# Patient Record
Sex: Female | Born: 1965 | Race: White | Hispanic: No | Marital: Married | Smoking: Never smoker
Health system: Southern US, Community
[De-identification: ages and names within clinical notes are randomized; demographics above are authoritative.]

## PROBLEM LIST (undated history)

## (undated) DIAGNOSIS — J302 Other seasonal allergic rhinitis: Secondary | ICD-10-CM

## (undated) DIAGNOSIS — N289 Disorder of kidney and ureter, unspecified: Secondary | ICD-10-CM

## (undated) DIAGNOSIS — N83209 Unspecified ovarian cyst, unspecified side: Secondary | ICD-10-CM

## (undated) DIAGNOSIS — K562 Volvulus: Secondary | ICD-10-CM

## (undated) HISTORY — DX: Other seasonal allergic rhinitis: J30.2

## (undated) HISTORY — DX: Volvulus: K56.2

## (undated) HISTORY — DX: Unspecified ovarian cyst, unspecified side: N83.209

---

## 1998-01-13 ENCOUNTER — Inpatient Hospital Stay (HOSPITAL_COMMUNITY): Admission: AD | Admit: 1998-01-13 | Discharge: 1998-01-13 | Payer: Self-pay | Admitting: Obstetrics and Gynecology

## 1998-01-17 ENCOUNTER — Observation Stay (HOSPITAL_COMMUNITY): Admission: AD | Admit: 1998-01-17 | Discharge: 1998-01-17 | Payer: Self-pay | Admitting: Obstetrics and Gynecology

## 1998-01-18 ENCOUNTER — Observation Stay (HOSPITAL_COMMUNITY): Admission: AD | Admit: 1998-01-18 | Discharge: 1998-01-18 | Payer: Self-pay | Admitting: Obstetrics and Gynecology

## 1998-01-25 ENCOUNTER — Inpatient Hospital Stay (HOSPITAL_COMMUNITY): Admission: AD | Admit: 1998-01-25 | Discharge: 1998-01-27 | Payer: Self-pay | Admitting: *Deleted

## 1998-03-06 ENCOUNTER — Other Ambulatory Visit: Admission: RE | Admit: 1998-03-06 | Discharge: 1998-03-06 | Payer: Self-pay | Admitting: Obstetrics and Gynecology

## 1999-08-30 ENCOUNTER — Other Ambulatory Visit: Admission: RE | Admit: 1999-08-30 | Discharge: 1999-08-30 | Payer: Self-pay | Admitting: Obstetrics and Gynecology

## 2002-05-05 ENCOUNTER — Other Ambulatory Visit: Admission: RE | Admit: 2002-05-05 | Discharge: 2002-05-05 | Payer: Self-pay | Admitting: Obstetrics and Gynecology

## 2003-07-19 ENCOUNTER — Other Ambulatory Visit: Admission: RE | Admit: 2003-07-19 | Discharge: 2003-07-19 | Payer: Self-pay | Admitting: Obstetrics and Gynecology

## 2003-12-08 ENCOUNTER — Ambulatory Visit (HOSPITAL_COMMUNITY): Admission: RE | Admit: 2003-12-08 | Discharge: 2003-12-08 | Payer: Self-pay | Admitting: Internal Medicine

## 2004-04-13 ENCOUNTER — Ambulatory Visit: Payer: Self-pay | Admitting: Internal Medicine

## 2005-06-27 ENCOUNTER — Ambulatory Visit: Payer: Self-pay | Admitting: Internal Medicine

## 2006-08-06 ENCOUNTER — Ambulatory Visit: Payer: Self-pay | Admitting: Internal Medicine

## 2017-10-09 ENCOUNTER — Encounter: Payer: Self-pay | Admitting: Obstetrics and Gynecology

## 2020-09-26 ENCOUNTER — Inpatient Hospital Stay (HOSPITAL_BASED_OUTPATIENT_CLINIC_OR_DEPARTMENT_OTHER)
Admission: EM | Admit: 2020-09-26 | Discharge: 2020-09-29 | DRG: 393 | Disposition: A | Discharge status: Home / Self Care | Payer: BLUE CROSS/BLUE SHIELD | Attending: Internal Medicine | Admitting: Internal Medicine

## 2020-09-26 ENCOUNTER — Emergency Department (HOSPITAL_BASED_OUTPATIENT_CLINIC_OR_DEPARTMENT_OTHER): Payer: BLUE CROSS/BLUE SHIELD

## 2020-09-26 ENCOUNTER — Other Ambulatory Visit: Payer: Self-pay

## 2020-09-26 ENCOUNTER — Encounter (HOSPITAL_BASED_OUTPATIENT_CLINIC_OR_DEPARTMENT_OTHER): Payer: Self-pay | Admitting: Obstetrics and Gynecology

## 2020-09-26 DIAGNOSIS — Z79899 Other long term (current) drug therapy: Secondary | ICD-10-CM | POA: Diagnosis not present

## 2020-09-26 DIAGNOSIS — Z888 Allergy status to other drugs, medicaments and biological substances status: Secondary | ICD-10-CM

## 2020-09-26 DIAGNOSIS — R933 Abnormal findings on diagnostic imaging of other parts of digestive tract: Secondary | ICD-10-CM | POA: Diagnosis not present

## 2020-09-26 DIAGNOSIS — R109 Unspecified abdominal pain: Secondary | ICD-10-CM | POA: Diagnosis not present

## 2020-09-26 DIAGNOSIS — K567 Ileus, unspecified: Secondary | ICD-10-CM | POA: Diagnosis present

## 2020-09-26 DIAGNOSIS — Z20822 Contact with and (suspected) exposure to covid-19: Secondary | ICD-10-CM | POA: Diagnosis present

## 2020-09-26 DIAGNOSIS — N83202 Unspecified ovarian cyst, left side: Secondary | ICD-10-CM | POA: Diagnosis present

## 2020-09-26 DIAGNOSIS — J690 Pneumonitis due to inhalation of food and vomit: Secondary | ICD-10-CM | POA: Diagnosis present

## 2020-09-26 DIAGNOSIS — J029 Acute pharyngitis, unspecified: Secondary | ICD-10-CM | POA: Diagnosis not present

## 2020-09-26 DIAGNOSIS — K562 Volvulus: Secondary | ICD-10-CM | POA: Diagnosis not present

## 2020-09-26 DIAGNOSIS — K644 Residual hemorrhoidal skin tags: Secondary | ICD-10-CM | POA: Diagnosis present

## 2020-09-26 DIAGNOSIS — K648 Other hemorrhoids: Secondary | ICD-10-CM | POA: Diagnosis present

## 2020-09-26 DIAGNOSIS — K5909 Other constipation: Secondary | ICD-10-CM | POA: Diagnosis present

## 2020-09-26 DIAGNOSIS — K56609 Unspecified intestinal obstruction, unspecified as to partial versus complete obstruction: Secondary | ICD-10-CM

## 2020-09-26 DIAGNOSIS — R079 Chest pain, unspecified: Secondary | ICD-10-CM

## 2020-09-26 DIAGNOSIS — Z791 Long term (current) use of non-steroidal anti-inflammatories (NSAID): Secondary | ICD-10-CM

## 2020-09-26 DIAGNOSIS — K5939 Other megacolon: Secondary | ICD-10-CM | POA: Diagnosis not present

## 2020-09-26 HISTORY — DX: Disorder of kidney and ureter, unspecified: N28.9

## 2020-09-26 LAB — URINALYSIS, ROUTINE W REFLEX MICROSCOPIC
Bilirubin Urine: NEGATIVE
Glucose, UA: NEGATIVE mg/dL
Hgb urine dipstick: NEGATIVE
Leukocytes,Ua: NEGATIVE
Nitrite: NEGATIVE
Protein, ur: NEGATIVE mg/dL
Specific Gravity, Urine: 1.005 (ref 1.005–1.030)
pH: 8 (ref 5.0–8.0)

## 2020-09-26 LAB — CBC
HCT: 40.3 % (ref 36.0–46.0)
Hemoglobin: 13.7 g/dL (ref 12.0–15.0)
MCH: 28.7 pg (ref 26.0–34.0)
MCHC: 34 g/dL (ref 30.0–36.0)
MCV: 84.5 fL (ref 80.0–100.0)
Platelets: 169 10*3/uL (ref 150–400)
RBC: 4.77 MIL/uL (ref 3.87–5.11)
RDW: 13 % (ref 11.5–15.5)
WBC: 8.3 10*3/uL (ref 4.0–10.5)
nRBC: 0 % (ref 0.0–0.2)

## 2020-09-26 LAB — COMPREHENSIVE METABOLIC PANEL
ALT: 21 U/L (ref 0–44)
AST: 36 U/L (ref 15–41)
Albumin: 4.5 g/dL (ref 3.5–5.0)
Alkaline Phosphatase: 45 U/L (ref 38–126)
Anion gap: 9 (ref 5–15)
BUN: 7 mg/dL (ref 6–20)
CO2: 25 mmol/L (ref 22–32)
Calcium: 9.6 mg/dL (ref 8.9–10.3)
Chloride: 102 mmol/L (ref 98–111)
Creatinine, Ser: 0.75 mg/dL (ref 0.44–1.00)
GFR, Estimated: >60 mL/min (ref >60)
Glucose, Bld: 95 mg/dL (ref 70–99)
Potassium: 5.1 mmol/L (ref 3.5–5.1)
Sodium: 136 mmol/L (ref 135–145)
Total Bilirubin: 0.8 mg/dL (ref 0.3–1.2)
Total Protein: 6.9 g/dL (ref 6.5–8.1)

## 2020-09-26 LAB — RESP PANEL BY RT-PCR (FLU A&B, COVID) ARPGX2
Influenza A by PCR: NEGATIVE
Influenza B by PCR: NEGATIVE
SARS Coronavirus 2 by RT PCR: NEGATIVE

## 2020-09-26 LAB — LIPASE, BLOOD: Lipase: 36 U/L (ref 11–51)

## 2020-09-26 LAB — PREGNANCY, URINE: Preg Test, Ur: NEGATIVE

## 2020-09-26 MED ORDER — IOHEXOL 300 MG/ML  SOLN
80.0000 mL | Freq: Once | INTRAMUSCULAR | Status: AC | PRN
  Administered 2020-09-26: 80 mL via INTRAVENOUS

## 2020-09-26 MED ORDER — ONDANSETRON HCL 4 MG PO TABS: 4.0000 mg | ORAL_TABLET | Freq: Four times a day (QID) | ORAL | Status: DC | PRN

## 2020-09-26 MED ORDER — ONDANSETRON HCL 4 MG/2ML IJ SOLN: 4.0000 mg | Freq: Four times a day (QID) | INTRAMUSCULAR | Status: DC | PRN

## 2020-09-26 MED ORDER — LACTATED RINGERS IV SOLN: INTRAVENOUS | Status: AC

## 2020-09-26 MED ORDER — MORPHINE SULFATE (PF) 2 MG/ML IV SOLN: 2.0000 mg | INTRAVENOUS | Status: DC | PRN

## 2020-09-26 NOTE — ED Provider Notes (Addendum)
[facility] EMERGENCY DEPT Provider Note   CSN: [id number] Arrival date & time: 09/26/20  1143     History Chief Complaint  Patient presents with  . Abdominal Pain    Dawn Potter is a 55 y.o. female.  HPI      55 year old female with history of exploratory laparotomy 30 years ago, episodes of constipation, presents with concern for severe abdominal pain and constipation.  No BM for 6 days, maybe longer Nausea, one episode of vomiting after taking magnesium citrate Abdominal pain, lower abdomen, feels "uterine", cramping, feels like a contraction, every 15 minutes having it, shortest time between is 3 minutes IUD, thinks LMP was 3/23 Has had prior constipation with menses--Last Monday thought it might have been premenstrual and got medication but this became progressively worse through the weekend, if got up and moved around had to sit down. Sunday AM started taking tylenol Miralax 7 doses since Sunday Senna, docusate Passing flatus, now is passing yellow fluid per rectum No vaginal bleeding/discharge No urinary symptoms, no hematuria No fever Phenteramine, used to take half a dose and started full dose Had exploratory surgery at age of 79, found ovarian cyst and kidney infection    Past Medical History:  Diagnosis Date  . Renal disorder     There are no problems to display for this patient.   Past Surgical History:  Procedure Laterality Date  . exploratory abdominla surgery N/A      OB History    Gravida      Para      Term      Preterm      AB      Living  2     SAB      IAB      Ectopic      Multiple      Live Births  2           No family history on file.  Social History   Tobacco Use  . Smoking status: Never Smoker  . Smokeless tobacco: Never Used  Vaping Use  . Vaping Use: Never used  Substance Use Topics  . Alcohol use: Yes    Comment: Social  . Drug use: Never    Home Medications Prior to  Admission medications   Medication Sig Start Date End Date Taking? Authorizing Provider  diphenhydrAMINE (BENADRYL) 25 MG tablet Take 25 mg by mouth every 6 (six) hours as needed.   Yes [provider]  meloxicam (MOBIC) 15 MG tablet Take 15 mg by mouth daily.   Yes [provider]  phentermine 37.5 MG capsule Take 37.5 mg by mouth every morning.   Yes [provider]  zolpidem (AMBIEN) 10 MG tablet Take 10 mg by mouth at bedtime as needed for sleep.   Yes [provider]    Allergies    Ketoconazole  Review of Systems   Review of Systems  Constitutional: Negative for fever.  HENT: Negative for sore throat.   Respiratory: Negative for cough and shortness of breath.   Cardiovascular: Negative for chest pain.  Gastrointestinal: Positive for abdominal pain, constipation, nausea and vomiting.  Genitourinary: Negative for difficulty urinating, flank pain and vaginal bleeding.  Musculoskeletal: Negative for back pain and neck pain.  Skin: Negative for rash.  Neurological: Negative for syncope.    Physical Exam Updated Vital Signs BP (!) 146/81 (BP Location: Left Arm)   Pulse 78   Temp 98.1 F (36.7 C) (Oral)  Resp 16   Ht 5\' 8"  (1.727 m)   Wt 74.8 kg   LMP 08/30/2020   SpO2 99%   BMI 25.09 kg/m   Physical Exam Vitals and nursing note reviewed.  Constitutional:      General: She is not in acute distress.    Appearance: She is well-developed. She is not diaphoretic.  HENT:     Head: Normocephalic and atraumatic.  Eyes:     Conjunctiva/sclera: Conjunctivae normal.  Cardiovascular:     Rate and Rhythm: Normal rate and regular rhythm.     Heart sounds: No gallop.   Pulmonary:     Effort: Pulmonary effort is normal. No respiratory distress.     Breath sounds: Normal breath sounds. No wheezing or rales.  Abdominal:     General: There is distension.     Palpations: Abdomen is soft.     Tenderness: There is generalized abdominal  tenderness. There is no guarding.  Musculoskeletal:        General: No tenderness.     Cervical back: Normal range of motion.  Skin:    General: Skin is warm and dry.     Findings: No erythema or rash.  Neurological:     Mental Status: She is alert and oriented to person, place, and time.     ED Results / Procedures / Treatments   Labs (all labs ordered are listed, but only abnormal results are displayed) Labs Reviewed  URINALYSIS, ROUTINE W REFLEX MICROSCOPIC - Abnormal; Notable for the following components:      Result Value   Ketones, ur TRACE (*)    All other components within normal limits  LIPASE, BLOOD  COMPREHENSIVE METABOLIC PANEL  CBC  PREGNANCY, URINE    EKG None  Radiology CT ABDOMEN PELVIS W CONTRAST  Result Date: 09/26/2020 CLINICAL DATA:  Abdominal pain.  Concern for bowel obstruction. EXAM: CT ABDOMEN AND PELVIS WITH CONTRAST TECHNIQUE: Multidetector CT imaging of the abdomen and pelvis was performed using the standard protocol following bolus administration of intravenous contrast. CONTRAST:  47mL OMNIPAQUE IOHEXOL 300 MG/ML  SOLN COMPARISON:  12/08/2003 FINDINGS: Lower chest: 3 mm right lower lobe nodule on image 1 of series 4 is stable in the long interval since prior study consistent with benign etiology. Hepatobiliary: No suspicious focal abnormality within the liver parenchyma. There is no evidence for gallstones, gallbladder wall thickening, or pericholecystic fluid. No intrahepatic or extrahepatic biliary dilation. Pancreas: No focal mass lesion. No dilatation of the main duct. No intraparenchymal cyst. No peripancreatic edema. Spleen: No splenomegaly. No focal mass lesion. Adrenals/Urinary Tract: No adrenal nodule or mass. Kidneys unremarkable. No evidence for hydroureter. The urinary bladder appears normal for the degree of distention. Stomach/Bowel: Tiny hiatal hernia. Stomach is decompressed. Duodenum is normally positioned as is the ligament of Treitz.  Small bowel is nondilated. Terminal ileum is well seen on coronal image 52/5 and is positioned just anterior to the right psoas muscle on axial 55/2 with the ileocecal valve visible on image 56/2. The ascending and transverse colon is dilated and stool-filled in the colon remains distended and stool-filled down to the level of the sigmoid segment. This sigmoid segment is markedly elongated and extends up into the anterior right abdomen, anterior to the liver. The sigmoid segment is air-filled and dilated with no substantial stool volume. There is mesenteric twisting with 1 area of narrowing identified in the sigmoid colon in the right central abdomen (axial image 44/2) representing the distal sigmoid colon although a more  proximal area of sigmoid colon narrowing is not evident. Distal sigmoid colon and rectum are fluid-filled. Vascular/Lymphatic: No abdominal aortic aneurysm. There is no gastrohepatic or hepatoduodenal ligament lymphadenopathy. No retroperitoneal or mesenteric lymphadenopathy. No pelvic sidewall lymphadenopathy. Reproductive: Adjacent cystic structures identified in the left adnexal space measuring 3.6 cm in 3.0 cm. Both lesions have attenuation higher than would be expected for simple fluid. No right adnexal mass. IUD is visualized in the uterus. Other: No intraperitoneal free fluid. Musculoskeletal: No worrisome lytic or sclerotic osseous abnormality. IMPRESSION: 1. No evidence for small bowel obstruction. The colon is diffusely dilated with cecum measuring up to 7-8 cm diameter. Sigmoid colon measures up to 5 cm diameter. The sigmoid colon is markedly elongated and redundant, extending up into the right abdomen, anterior to the liver. There is an area of narrowing in the distal sigmoid colon as it enters the low pelvis with some associated mesenteric twisting, but a second area of sigmoid colon narrowing cannot be identified. There is no colonic wall thickening or associated pericolonic  edema/intraperitoneal fluid. Imaging features may be related to marked colonic ileus/colonic dysmotility with the single area of narrowing bean related to sigmoid colon becoming non dependent towards the rectum in the low pelvis. Sigmoid volvulus is also a consideration, but not a definite CT finding. 2. Complex cystic lesions in the left adnexal space/ovary measuring up to 3.6 cm. These may be hemorrhagic cysts. Pelvic ultrasound follow-up recommended to further evaluate. Electronically Signed   By: Kennith Center M.D.   On: 09/26/2020 14:32    Procedures Procedures   Medications Ordered in ED Medications  iohexol (OMNIPAQUE) 300 MG/ML solution 80 mL (80 mLs Intravenous Contrast Given 09/26/20 1328)    ED Course  I have reviewed the triage vital signs and the nursing notes.  Pertinent labs & imaging results that were available during my care of the patient were reviewed by me and considered in my medical decision making (see chart for details).    MDM Rules/Calculators/A&P                          55 year old female with history of exploratory laparotomy 30 years ago, episodes of constipation, presents with concern for severe abdominal pain and constipation.  DDx includes appendicitis, pancreatitis, cholecystitis, pyelonephritis, nephrolithiasis, diverticulitis, PID, ovarian torsion, ectopic pregnancy, and tuboovarian abscess, SBO, constipation.   Labs completed show no sign of pancreatitis, hepatitis or other acute abnormalities.  No sign of UTI. Pregnancy test negative.  CT abdomen pelvis shows significant dilation of cecum and sigmoid colon with elongated and reduntant extending into the right abdomen with area of narrowing with associated mesenteric twisting, may be secondary to colonic ileus/dysmotility or sigmoid volvulus, complex adnexal cysts.   Discussed with Dr. Fredricka Bonine General Surgery who recommends hospitalist admission and GI consult.  Discussed with Dr. Alvino Chapel hospitalist.    Spoke with Willette Cluster NP [facility] as patient has preference to see Dr. Andrey Farmer as an outpatient/PCP was going to refer her although they have not seen her.    Plan is to admit to [facility] with GI consult as above. Patient would like to go by POV which is reasonable given hemodynamic stability.    Final Clinical Impression(s) / ED Diagnoses Final diagnoses:  Intestinal obstruction, unspecified cause, unspecified whether partial or complete [facility])    Rx / DC Orders ED Discharge Orders    None        Alvira Monday, MD 09/26/20 1605

## 2020-09-26 NOTE — Progress Notes (Signed)
Received a call from [facility], Dr. Dalene Seltzer. Patient in ED with abdominal pain, nausea. CT scan shows diffusely dilated colon, cecum 7-8 cm. Colon markedly redundant.  Area of narrowing in the distal sigmoid with some associated mesenteric twisting but a second area of sigmoid colon narrowing cannot be identified. Sigmoid volvulus is a consideration.   Patient was going to be admitted to [facility]. I called ED back to see about getting patient brought to [facility] instead. Dr. Lavon Paganini is here and can proceed with urgent lower endoscopy if indicated.    * Patient known remotely to Dr. Leone Payor.

## 2020-09-26 NOTE — Consult Note (Addendum)
[facility] Surgery Consult Note  Dawn Potter [birth year]  [id number].    Requesting MD: Alvira Monday Chief Complaint/Reason for Consult: constipation  HPI:  Dawn Potter is a 54yo female who was transferred from [facility] ED to [facility] for evaluation of persistent constipation and abdominal pain. Patient states that she developed lower abdominal pain over the weekend. It is crampy and has gotten progressively worse.  She endorses lower abdominal cramps which feel like labor every 6 to 15 minutes since 10:00 this morning.  She has experienced this in the past and previously attributed it to her menstrual cycle.  This episode associated with nausea and 1 episode of emesis after taking magnesium citrate. She is passing some flatus and what sounds like mucus material but last BM was at least 6 days ago. She has tried taking miralax, senna, docusate with no benefit.   She had an exploratory laparotomy at age 29 which was done for a persistent high-grade fever.  She states the findings were a large ovarian cyst, she does not remember which side, and ultimately she was diagnosed with a severe kidney infection.  Since that time she has struggled with constipation chronically but this is the worst episode.   She is extremely active and essentially healthy, stating that she did all of the farm chores this past weekend despite having abdominal issues.  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  In the ED her BP was found to be elevated, otherwise vital signs stable. Lab work is unremarkable. CT scan shows no evidence for SBO; colon is diffusely dilated with cecum measuring up to 7-8 cm diameter; sigmoid colon is markedly elongated and redundant; there is an area of narrowing in the distal sigmoid colon as it enters the low pelvis with some associated mesenteric twisting; no colonic wall thickening or associated pericolonic edema/intraperitoneal fluid; ?colonic ileus/colonic dysmotility or  sigmoid volvulus.  Last colonoscopy  Anticoagulants: none Nonsmoker Drinks alcohol occasionally Denies illicit drug use  Review of Systems  All other systems reviewed and are negative.   All systems reviewed and otherwise negative except for as above  No family history on file.  Past Medical History:  Diagnosis Date  . Renal disorder     Past Surgical History:  Procedure Laterality Date  . exploratory abdominla surgery N/A     Social History:  reports that she has never smoked. She has never used smokeless tobacco. She reports current alcohol use. She reports that she does not use drugs.  Allergies:  Allergies  Allergen Reactions  . Ketoconazole     Hives    (Not in a hospital admission)   Prior to Admission medications   Medication Sig Start Date End Date Taking? Authorizing Provider  diphenhydrAMINE (BENADRYL) 25 MG tablet Take 25 mg by mouth every 6 (six) hours as needed.   Yes [provider]  meloxicam (MOBIC) 15 MG tablet Take 15 mg by mouth daily.   Yes [provider]  phentermine 37.5 MG capsule Take 37.5 mg by mouth every morning.   Yes [provider]  zolpidem (AMBIEN) 10 MG tablet Take 10 mg by mouth at bedtime as needed for sleep.   Yes [provider]    Blood pressure (!) 146/81, pulse 78, temperature 98.1 F (36.7 C), temperature source Oral, resp. rate 16, height 5\' 8"  (1.727 m), weight 74.8 kg, last menstrual period 08/30/2020, SpO2 99 %. Physical Exam: General: pleasant, WD/WN female who is sitting up in the  chair, appears quite comfortable in NAD HEENT: head is normocephalic, atraumatic.  Sclera are noninjected.  Pupils equal and round.  Ears and nose without any masses or lesions.  Mouth is pink and moist. Dentition in good condition Heart: regular, rate, and rhythm.  Normal s1,s2. No obvious murmurs, gallops, or rubs noted.  Palpable pedal pulses bilaterally  Lungs: CTAB, no wheezes, rhonchi, or rales  noted.  Respiratory effort nonlabored Abd: soft, very minimally subjectively tender, mildly distended, no masses.  Low midline laparotomy scar MS: no BUE/BLE edema, calves soft and nontender Skin: warm and dry with no masses, lesions, or rashes Psych: A&Ox4 with an appropriate affect Neuro: cranial nerves grossly intact, equal strength in BUE/BLE bilaterally, normal speech, thought process intact  Results for orders placed or performed during the hospital encounter of 09/26/20 (from the past 48 hour(s))  Lipase, blood     Status: None   Collection Time: 09/26/20 12:15 PM  Result Value Ref Range   Lipase 36 11 - 51 U/L    Comment: Performed at [facility]  Comprehensive metabolic panel     Status: None   Collection Time: 09/26/20 12:15 PM  Result Value Ref Range   Sodium 136 135 - 145 mmol/L   Potassium 5.1 3.5 - 5.1 mmol/L   Chloride 102 98 - 111 mmol/L   CO2 25 22 - 32 mmol/L   Glucose, Bld 95 70 - 99 mg/dL    Comment: Glucose reference range applies only to samples taken after fasting for at least 8 hours.   BUN 7 6 - 20 mg/dL   Creatinine, Ser 1.610.75 0.44 - 1.00 mg/dL   Calcium 9.6 8.9 - 09.610.3 mg/dL   Total Protein 6.9 6.5 - 8.1 g/dL   Albumin 4.5 3.5 - 5.0 g/dL   AST 36 15 - 41 U/L    Comment: SPECIMEN HEMOLYZED. HEMOLYSIS MAY AFFECT INTEGRITY OF RESULTS.   ALT 21 0 - 44 U/L   Alkaline Phosphatase 45 38 - 126 U/L   Total Bilirubin 0.8 0.3 - 1.2 mg/dL   GFR, Estimated >04>60 >54>60 mL/min    Comment: (NOTE) Calculated using the CKD-EPI Creatinine Equation (2021)    Anion gap 9 5 - 15    Comment: Performed at [facility]  CBC     Status: None   Collection Time: 09/26/20 12:15 PM  Result Value Ref Range   WBC 8.3 4.0 - 10.5 K/uL   RBC 4.77 3.87 - 5.11 MIL/uL   Hemoglobin 13.7 12.0 - 15.0 g/dL   HCT 09.840.3 11.936.0 - 14.746.0 %   MCV 84.5 80.0 - 100.0 fL   MCH 28.7 26.0 - 34.0 pg   MCHC 34.0 30.0 - 36.0 g/dL   RDW 82.913.0 56.211.5 - 13.015.5 %   Platelets 169  150 - 400 K/uL   nRBC 0.0 0.0 - 0.2 %    Comment: Performed at [facility]  Urinalysis, Routine w reflex microscopic Urine, Clean Catch     Status: Abnormal   Collection Time: 09/26/20 12:15 PM  Result Value Ref Range   Color, Urine YELLOW YELLOW   APPearance CLEAR CLEAR   Specific Gravity, Urine 1.005 1.005 - 1.030   pH 8.0 5.0 - 8.0   Glucose, UA NEGATIVE NEGATIVE mg/dL   Hgb urine dipstick NEGATIVE NEGATIVE   Bilirubin Urine NEGATIVE NEGATIVE   Ketones, ur TRACE (A) NEGATIVE mg/dL   Protein, ur NEGATIVE NEGATIVE mg/dL   Nitrite NEGATIVE NEGATIVE   Leukocytes,Ua  NEGATIVE NEGATIVE    Comment: Performed at [facility]  Pregnancy, urine     Status: None   Collection Time: 09/26/20 12:15 PM  Result Value Ref Range   Preg Test, Ur NEGATIVE NEGATIVE    Comment:        THE SENSITIVITY OF THIS METHODOLOGY IS >20 mIU/mL. Performed at [facility]    CT ABDOMEN PELVIS W CONTRAST  Result Date: 09/26/2020 CLINICAL DATA:  Abdominal pain.  Concern for bowel obstruction. EXAM: CT ABDOMEN AND PELVIS WITH CONTRAST TECHNIQUE: Multidetector CT imaging of the abdomen and pelvis was performed using the standard protocol following bolus administration of intravenous contrast. CONTRAST:  100mL OMNIPAQUE IOHEXOL 300 MG/ML  SOLN COMPARISON:  12/08/2003 FINDINGS: Lower chest: 3 mm right lower lobe nodule on image 1 of series 4 is stable in the long interval since prior study consistent with benign etiology. Hepatobiliary: No suspicious focal abnormality within the liver parenchyma. There is no evidence for gallstones, gallbladder wall thickening, or pericholecystic fluid. No intrahepatic or extrahepatic biliary dilation. Pancreas: No focal mass lesion. No dilatation of the main duct. No intraparenchymal cyst. No peripancreatic edema. Spleen: No splenomegaly. No focal mass lesion. Adrenals/Urinary Tract: No adrenal nodule or mass. Kidneys unremarkable. No  evidence for hydroureter. The urinary bladder appears normal for the degree of distention. Stomach/Bowel: Tiny hiatal hernia. Stomach is decompressed. Duodenum is normally positioned as is the ligament of Treitz. Small bowel is nondilated. Terminal ileum is well seen on coronal image 52/5 and is positioned just anterior to the right psoas muscle on axial 55/2 with the ileocecal valve visible on image 56/2. The ascending and transverse colon is dilated and stool-filled in the colon remains distended and stool-filled down to the level of the sigmoid segment. This sigmoid segment is markedly elongated and extends up into the anterior right abdomen, anterior to the liver. The sigmoid segment is air-filled and dilated with no substantial stool volume. There is mesenteric twisting with 1 area of narrowing identified in the sigmoid colon in the right central abdomen (axial image 44/2) representing the distal sigmoid colon although a more proximal area of sigmoid colon narrowing is not evident. Distal sigmoid colon and rectum are fluid-filled. Vascular/Lymphatic: No abdominal aortic aneurysm. There is no gastrohepatic or hepatoduodenal ligament lymphadenopathy. No retroperitoneal or mesenteric lymphadenopathy. No pelvic sidewall lymphadenopathy. Reproductive: Adjacent cystic structures identified in the left adnexal space measuring 3.6 cm in 3.0 cm. Both lesions have attenuation higher than would be expected for simple fluid. No right adnexal mass. IUD is visualized in the uterus. Other: No intraperitoneal free fluid. Musculoskeletal: No worrisome lytic or sclerotic osseous abnormality. IMPRESSION: 1. No evidence for small bowel obstruction. The colon is diffusely dilated with cecum measuring up to 7-8 cm diameter. Sigmoid colon measures up to 5 cm diameter. The sigmoid colon is markedly elongated and redundant, extending up into the right abdomen, anterior to the liver. There is an area of narrowing in the distal sigmoid  colon as it enters the low pelvis with some associated mesenteric twisting, but a second area of sigmoid colon narrowing cannot be identified. There is no colonic wall thickening or associated pericolonic edema/intraperitoneal fluid. Imaging features may be related to marked colonic ileus/colonic dysmotility with the single area of narrowing bean related to sigmoid colon becoming non dependent towards the rectum in the low pelvis. Sigmoid volvulus is also a consideration, but not a definite CT finding. 2. Complex cystic lesions in the left adnexal space/ovary measuring up  to 3.6 cm. These may be hemorrhagic cysts. Pelvic ultrasound follow-up recommended to further evaluate. Electronically Signed   By: Kennith Center M.D.   On: 09/26/2020 14:32   Anti-infectives (From admission, onward)   None        Assessment/Plan  Abdominal pain, constipation Dilated cecum/very redundant and dilated sigmoid colon - CT scan shows no evidence for SBO; colon is diffusely dilated with cecum measuring up to 7-8 cm diameter; sigmoid colon is markedly elongated and redundant; there is an area of narrowing in the distal sigmoid colon as it enters the low pelvis with some associated mesenteric twisting; no colonic wall thickening or associated pericolonic edema/intraperitoneal fluid; ?colonic ileus/colonic dysmotility or sigmoid volvulus - WBC WNL, VSS - Recommend medical admission and GI consult for possible decompressive/diagnostic colonoscopy.  Would keep n.p.o. tonight.  Given her surgical history it is possible that she has adhesive disease contributing to her chronic constipation; additionally she has cystic left adnexal structures which may be forming a nidus for chronic low-grade obstruction.  At this point she has a reassuring/benign abdominal exam and no acute surgical intervention is indicated.  We will follow along.  ID - none VTE - per primary FEN - NPO Foley - none Follow up - TBD  Berna Bue MD  [facility] Surgery 09/26/2020, 4:09 PM Please see Amion for pager number during day hours 7:00am-4:30pm

## 2020-09-26 NOTE — H&P (Signed)
History and Physical    Dawn Potter KXF:[id number] DOB: [birth year] DOA: 09/26/2020  PCP: Olivia Mackie, MD  Patient coming from: [facility]  I have personally briefly reviewed patient's old medical records in [facility]  Chief Complaint: Abdominal pain and constipation  HPI: Dawn Potter is a 56 y.o. female with medical history significant for exploratory laparotomy 31 years ago with subsequent chronic constipation who presents to the ED for evaluation of severe abdominal pain and constipation.  Patient notes a chronic history of significant constipation.  This has been ongoing since she had exploratory laparotomy 31 years ago to evaluate for persistent high fever at which time she was ultimately found to have a large ovarian cyst and a severe kidney infection.  She will go between 1-4 weeks between bowel movements.  She has correlated worsening abdominal pain during times of ovulation.  She says she had similar symptoms begin recently however she has had persistent intermittent cramping abdominal pain and is now only passing mucus with bowel movements.  Abdominal pain was initially left-sided but now occurs in her right lower quadrant.  She has been using MiraLAX, senna, docusate, and mag citrate and attempts to move her bowels.  She has not really had any nausea or vomiting except for when she took the mag citrate.  She otherwise denies any subjective fevers, chills, diaphoresis, chest pain, dyspnea.  She was unable to get seen in GI clinic therefore went to [facility] ED for further evaluation.  [facility] ED Course:  Initial vitals showed BP 120/80, pulse 72, RR 17, temp 98.1 F, SPO2 100% on room air.  Labs show sodium 136, potassium 5.1, bicarb 25, BUN 7, creatinine 0.75, serum glucose 95, LFTs within normal limits, WBC 8.3, hemoglobin 13.7, platelets 169,000.  Urinalysis negative for UTI.  Urine pregnancy test negative.  SARS-CoV-2  PCR negative.  CT abdomen/pelvis with contrast shows changes concerning for colonic ileus with possible sigmoid volvulus.  Complex cystic lesions in the left adnexal space/ovary also noted.  EDP discussed with on-call general surgery and GI who recommended admission to [facility] under hospitalist service.  Review of Systems: All systems reviewed and are negative except as documented in history of present illness above.   Past Medical History:  Diagnosis Date  . Renal disorder     Past Surgical History:  Procedure Laterality Date  . exploratory abdominla surgery N/A     Social History:  reports that she has never smoked. She has never used smokeless tobacco. She reports current alcohol use. She reports that she does not use drugs.  Allergies  Allergen Reactions  . Ketoconazole     Hives    No family history on file.   Prior to Admission medications   Medication Sig Start Date End Date Taking? Authorizing Provider  diphenhydrAMINE (BENADRYL) 25 MG tablet Take 25 mg by mouth every 6 (six) hours as needed.   Yes [provider]  meloxicam (MOBIC) 15 MG tablet Take 15 mg by mouth daily.   Yes [provider]  phentermine 37.5 MG capsule Take 37.5 mg by mouth every morning.   Yes [provider]  zolpidem (AMBIEN) 10 MG tablet Take 10 mg by mouth at bedtime as needed for sleep.   Yes [provider]    Physical Exam: Vitals:   09/26/20 1415 09/26/20 1500 09/26/20 1831 09/26/20 2018  BP: 139/86 (!) 146/81 122/77 134/80  Pulse: 65 78 74 62  Resp: 16  16 18 18   Temp:   98.9 F (37.2 C) 98.4 F (36.9 C)  TempSrc:   Oral Oral  SpO2: 100% 99% 97% 99%  Weight:      Height:       Constitutional: Sitting up in chair, NAD, calm, comfortable Eyes: PERRL, lids and conjunctivae normal ENMT: Mucous membranes are moist. Posterior pharynx clear of any exudate or lesions.Normal dentition.  Neck: normal, supple, no masses. Respiratory: clear to  auscultation bilaterally, no wheezing, no crackles. Normal respiratory effort. No accessory muscle use.  Cardiovascular: Regular rate and rhythm, no murmurs / rubs / gallops. No extremity edema. 2+ pedal pulses. Abdomen: no tenderness, no masses palpated. No hepatosplenomegaly. Musculoskeletal: no clubbing / cyanosis. No joint deformity upper and lower extremities. Good ROM, no contractures. Normal muscle tone.  Skin: no rashes, lesions, ulcers. No induration Neurologic: CN 2-12 grossly intact. Sensation intact. Strength 5/5 in all 4.  Psychiatric: Normal judgment and insight. Alert and oriented x 3. Normal mood.   Labs on Admission: I have personally reviewed following labs and imaging studies  CBC: Recent Labs  Lab 09/26/20 1215  WBC 8.3  HGB 13.7  HCT 40.3  MCV 84.5  PLT 169   Basic Metabolic Panel: Recent Labs  Lab 09/26/20 1215  NA 136  K 5.1  CL 102  CO2 25  GLUCOSE 95  BUN 7  CREATININE 0.75  CALCIUM 9.6   GFR: Estimated Creatinine Clearance: 82 mL/min (by C-G formula based on SCr of 0.75 mg/dL). Liver Function Tests: Recent Labs  Lab 09/26/20 1215  AST 36  ALT 21  ALKPHOS 45  BILITOT 0.8  PROT 6.9  ALBUMIN 4.5   Recent Labs  Lab 09/26/20 1215  LIPASE 36   No results for input(s): AMMONIA in the last 168 hours. Coagulation Profile: No results for input(s): INR, PROTIME in the last 168 hours. Cardiac Enzymes: No results for input(s): CKTOTAL, CKMB, CKMBINDEX, TROPONINI in the last 168 hours. BNP (last 3 results) No results for input(s): PROBNP in the last 8760 hours. HbA1C: No results for input(s): HGBA1C in the last 72 hours. CBG: No results for input(s): GLUCAP in the last 168 hours. Lipid Profile: No results for input(s): CHOL, HDL, LDLCALC, TRIG, CHOLHDL, LDLDIRECT in the last 72 hours. Thyroid Function Tests: No results for input(s): TSH, T4TOTAL, FREET4, T3FREE, THYROIDAB in the last 72 hours. Anemia Panel: No results for input(s):  VITAMINB12, FOLATE, FERRITIN, TIBC, IRON, RETICCTPCT in the last 72 hours. Urine analysis:    Component Value Date/Time   COLORURINE YELLOW 09/26/2020 1215   APPEARANCEUR CLEAR 09/26/2020 1215   LABSPEC 1.005 09/26/2020 1215   PHURINE 8.0 09/26/2020 1215   GLUCOSEU NEGATIVE 09/26/2020 1215   HGBUR NEGATIVE 09/26/2020 1215   BILIRUBINUR NEGATIVE 09/26/2020 1215   KETONESUR TRACE (A) 09/26/2020 1215   PROTEINUR NEGATIVE 09/26/2020 1215   NITRITE NEGATIVE 09/26/2020 1215   LEUKOCYTESUR NEGATIVE 09/26/2020 1215    Radiological Exams on Admission: CT ABDOMEN PELVIS W CONTRAST  Result Date: 09/26/2020 CLINICAL DATA:  Abdominal pain.  Concern for bowel obstruction. EXAM: CT ABDOMEN AND PELVIS WITH CONTRAST TECHNIQUE: Multidetector CT imaging of the abdomen and pelvis was performed using the standard protocol following bolus administration of intravenous contrast. CONTRAST:  70mL OMNIPAQUE IOHEXOL 300 MG/ML  SOLN COMPARISON:  12/08/2003 FINDINGS: Lower chest: 3 mm right lower lobe nodule on image 1 of series 4 is stable in the long interval since prior study consistent with benign etiology. Hepatobiliary: No suspicious focal abnormality within  the liver parenchyma. There is no evidence for gallstones, gallbladder wall thickening, or pericholecystic fluid. No intrahepatic or extrahepatic biliary dilation. Pancreas: No focal mass lesion. No dilatation of the main duct. No intraparenchymal cyst. No peripancreatic edema. Spleen: No splenomegaly. No focal mass lesion. Adrenals/Urinary Tract: No adrenal nodule or mass. Kidneys unremarkable. No evidence for hydroureter. The urinary bladder appears normal for the degree of distention. Stomach/Bowel: Tiny hiatal hernia. Stomach is decompressed. Duodenum is normally positioned as is the ligament of Treitz. Small bowel is nondilated. Terminal ileum is well seen on coronal image 52/5 and is positioned just anterior to the right psoas muscle on axial 55/2 with the  ileocecal valve visible on image 56/2. The ascending and transverse colon is dilated and stool-filled in the colon remains distended and stool-filled down to the level of the sigmoid segment. This sigmoid segment is markedly elongated and extends up into the anterior right abdomen, anterior to the liver. The sigmoid segment is air-filled and dilated with no substantial stool volume. There is mesenteric twisting with 1 area of narrowing identified in the sigmoid colon in the right central abdomen (axial image 44/2) representing the distal sigmoid colon although a more proximal area of sigmoid colon narrowing is not evident. Distal sigmoid colon and rectum are fluid-filled. Vascular/Lymphatic: No abdominal aortic aneurysm. There is no gastrohepatic or hepatoduodenal ligament lymphadenopathy. No retroperitoneal or mesenteric lymphadenopathy. No pelvic sidewall lymphadenopathy. Reproductive: Adjacent cystic structures identified in the left adnexal space measuring 3.6 cm in 3.0 cm. Both lesions have attenuation higher than would be expected for simple fluid. No right adnexal mass. IUD is visualized in the uterus. Other: No intraperitoneal free fluid. Musculoskeletal: No worrisome lytic or sclerotic osseous abnormality. IMPRESSION: 1. No evidence for small bowel obstruction. The colon is diffusely dilated with cecum measuring up to 7-8 cm diameter. Sigmoid colon measures up to 5 cm diameter. The sigmoid colon is markedly elongated and redundant, extending up into the right abdomen, anterior to the liver. There is an area of narrowing in the distal sigmoid colon as it enters the low pelvis with some associated mesenteric twisting, but a second area of sigmoid colon narrowing cannot be identified. There is no colonic wall thickening or associated pericolonic edema/intraperitoneal fluid. Imaging features may be related to marked colonic ileus/colonic dysmotility with the single area of narrowing bean related to sigmoid  colon becoming non dependent towards the rectum in the low pelvis. Sigmoid volvulus is also a consideration, but not a definite CT finding. 2. Complex cystic lesions in the left adnexal space/ovary measuring up to 3.6 cm. These may be hemorrhagic cysts. Pelvic ultrasound follow-up recommended to further evaluate. Electronically Signed   By: Eric  MansellKennith Center M.D.   On: 09/26/2020 14:32    EKG: Not performed.  Assessment/Plan Active Problems:   Ileus (HCC)   Dawn Potter is a 55 y.o. female with medical history significant for exploratory laparotomy 31 years ago with subsequent chronic constipation who is admitted with ileus versus severe constipation/colonic dysmotility with possible sigmoid volvulus.  Ileus/severe constipation/colonic dysmotility with possible sigmoid volvulus: General surgery and GI following.  Plan is for water-soluble barium enema and then potential colonoscopy pending results.  Keep n.p.o. after midnight.  IV fluid hydration overnight, antiemetics as needed.  Left adnexal complex cystic lesions: Seen on CT imaging.  Patient reports history of same evaluated years ago.  Remote CT pelvis in 2005 showed a 7 cm diameter cyst in the left adnexa.  Consider follow-up pelvic ultrasound.  DVT  prophylaxis: SCDs Code Status: Full code, confirmed with patient Family Communication: Discussed with patient's spouse at bedside Disposition Plan: From home, dispo pending further GI/general surgery evaluation/management Consults called: GI, general surgery Level of care: Med-Surg Admission status:  Status is: Inpatient  Remains inpatient appropriate because:Ongoing diagnostic testing needed not appropriate for outpatient work up   Dispo: The patient is from: Home              Anticipated d/c is to: Home              Patient currently is not medically stable to d/c.   Difficult to place patient No  Darreld Mclean MD [facility]  If 7PM-7AM, please contact  night-coverage [email or web address]  09/26/2020, 11:12 PM

## 2020-09-26 NOTE — ED Triage Notes (Signed)
Patient reports to the ER for abdominal pain. Patient reports she believes she has a bowel obstruction. Patient states no BM x6 days despite miralax, mag citrate, senna, and docusate.

## 2020-09-26 NOTE — ED Notes (Signed)
Patient transported to CT 

## 2020-09-26 NOTE — ED Notes (Signed)
Patient OK to go by private vehicle per MD

## 2020-09-26 NOTE — ED Notes (Signed)
Patient aware urine sample needed

## 2020-09-26 NOTE — Consult Note (Addendum)
Referring Provider: Alvira MondaySchlossman, Erin, MD Primary Care Physician:  Olivia Mackieaavon, Richard, MD  Primary Gastroenterologist: Previously Dr. Marisue BrooklynJoythi Mann, patient called [facility] yesterday to make an appointment with Dr. Leone PayorGessner  Reason for Consultation:  Abdominal pain, possible sigmoid   IMPRESSION:  Acute abdominal pain in the setting of severe chronic constipation Abnormal CT scan showing dilated cecum and very redundant and dilated sigmoid colon    - normal colon and SB on contrasted CT in 2005 Left adnexal cystic lesion on CT scan Failed attempt at colonoscopy in 2015 due to poor bowel prep  Some clinical improvement without specific intervention over the last 48 hours, although pain and constipation persist. Abdominal exam is suprisingly benign compared to her CT scan findings.   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 Acute findings may suggest concurrent sigmoid ileus, low grade obstruction due to adhesions of symptomatic adnexal cyst, as well as sigmoid volvulus, although CT images are not pathognomic for volvulus. No indication for acute colonic decompression tonight.     PLAN: Clear liquid tonight (patient request) NPO at midnight Water soluble barium enema to evaluate for volvulus (discussed with radiology) Consider colonoscopy after reviewing those results  Consider pelvic ultrasound to further evaluate the adnexal abnormality Surgery is following  Please see the "Patient Instructions" section for addition details about the plan.  HPI: Dawn Potter is a 55 y.o. female admitted through the emergency room for further evaluation of abdominal pain. She was seen urgently tonight with concerns for possible volvulus. The history is obtained through the patient, review of her electronic health record, and her husband who was present at the bedside. 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   Patient relates a history of altered bowel habits that began 31 years ago following an exploratory laparotomy performed to evaluate a persistent high-grade fever. She was ultimately found to have a large ovarian cyst and ultimately a severe kidney infection. Has had chronic constipation since that surgery. Will frequently go 7-28 days between bowel movements. Finds that during menses her constipation improves after a period of escalating abdominal pain. Associated straining, sense of incomplete evacuation, and intermittent need for manual assistance with defecation.  Will use apples, Miralax, Senna, and docusate as needed. She has never used prescription medications for constipation.  She is previously been evaluated by Charna ElizabethJyothi Mann.  Had a attempted colonoscopy in 2015 that was unsuccessful due to a poor prep.  With the negativity of her interaction she has deferred further attempts at colonoscopy or colon cancer screening.  Acute symptoms developed 8 days ago with migrating abdominal pain, worsening constipation, distension and intermittent nausea. Become severe 4 days. No bowel movement in 6-7 days, but, significant flatus and passage of mucous. Symptoms improved with repositioning. No significant improvement despite use of Miralax, Senna, Docusate, or Magnesium citrate. Put off seeking care because she was busy at work. Spoke with multiple friends in the health profession who expressed concerns that she may have necrotic bowel. Despite some improvement in her symptoms, these concerns ultimately led her to call Dr. Billy Coastaavon yesterday who suggested that she see GI.  She called our office to make an appointment with Dr. Leone PayorGessner but was told he had no availability until May or June.  She reports our nursing staff told the [facility].  On presentation, she was hemodynamiclly stable. She was reporting cramping  that occurs every 15 minutes, similar to  menstrual cramps. She continues to have urgency, but, when she tries to defecate she passes a lot of gas and mucous but no stool.  She has been hungry, and tolerate small bites of some foods over the last week.   Labs in the ED show a normal CMP, lipase and CBC. WBC is 8.3. Lactic acid not obtained.  Surgical consultation did not feel that acute surgical intervention was needed.   I have personally reviewed her CT scan from the ED. It shows diffusely dilated colon with a cecum of 7-8 cm, and a elongated and redundant sigmoid colon. There is an area of narrowing in the distal sigmoid colon but no colonic wall thickening, pericolonic edema, or double loop obstruction. There is stool down to the mid sigmoid colon, but, the sigmoid is largely void of stool. There is a complex cystic lesion in the left adnexal space.  Review of Epic showed that she had a contrasted CT abd/pelvis in 2005 with normal appearing small bowel and colon. There was a small hiatal hernia and a 7cm left adnexal cyst that causes mass effect on the bladder.   No known family history of colon cancer. No family history of uterine/endometrial cancer, pancreatic cancer or gastric/stomach cancer.   Past Medical History:  Diagnosis Date  . Renal disorder     Past Surgical History:  Procedure Laterality Date  . exploratory abdominla surgery N/A     Current Facility-Administered Medications  Medication Dose Route Frequency Provider Last Rate Last Admin  . lactated ringers infusion   Intravenous Continuous Darreld Mclean R, MD      . morphine 2 MG/ML injection 2 mg  2 mg Intravenous Q4H PRN Darreld Mclean R, MD      . ondansetron (ZOFRAN) tablet 4 mg  4 mg Oral Q6H PRN Charlsie Quest, MD       Or  . ondansetron (ZOFRAN) injection 4 mg  4 mg Intravenous Q6H PRN Charlsie Quest, MD        Allergies as of 09/26/2020 - Review Complete 09/26/2020  Allergen Reaction Noted  . Ketoconazole  09/26/2020    No family history on  file.   Review of Systems: 12 system ROS is negative except as noted above.   Physical Exam: General:   Alert,  well-nourished, pleasant and cooperative in NAD, sitting in the hospital chair wearing street clothes, laughing with her family and friends who are at the bedside. Initially did not admit she was the patient.  Head:  Normocephalic and atraumatic. Eyes:  Sclera clear, no icterus.   Conjunctiva pink. Ears:  Normal auditory acuity. Nose:  No deformity, discharge,  or lesions. Mouth:  No deformity or lesions.   Neck:  Supple; no masses or thyromegaly. Lungs:  Clear throughout to auscultation.   No wheezes. Heart:  Regular rate and rhythm; no murmurs. Abdomen:  Soft, mild mid/LLQ tenderness without rebound or guarding, nondistended, normal bowel sounds. No tympany.  No hepatosplenomegaly. Well-healed low midline scar. No tenderness in the area of the scar.  Rectal:  Deferred  Msk:  Symmetrical. No boney deformities LAD: No inguinal or umbilical LAD Extremities:  No clubbing or edema. Neurologic:  Alert and  oriented x4;  grossly nonfocal Skin:  Intact without significant lesions or rashes. Psych:  Alert and cooperative. Normal mood and affect.   Lab Results: Recent Labs    09/26/20 1215  WBC 8.3  HGB 13.7  HCT 40.3  PLT  169   BMET Recent Labs    09/26/20 1215  NA 136  K 5.1  CL 102  CO2 25  GLUCOSE 95  BUN 7  CREATININE 0.75  CALCIUM 9.6   LFT Recent Labs    09/26/20 1215  PROT 6.9  ALBUMIN 4.5  AST 36  ALT 21  ALKPHOS 45  BILITOT 0.8     Studies/Results: CT ABDOMEN PELVIS W CONTRAST  Result Date: 09/26/2020 CLINICAL DATA:  Abdominal pain.  Concern for bowel obstruction. EXAM: CT ABDOMEN AND PELVIS WITH CONTRAST TECHNIQUE: Multidetector CT imaging of the abdomen and pelvis was performed using the standard protocol following bolus administration of intravenous contrast. CONTRAST:  34mL OMNIPAQUE IOHEXOL 300 MG/ML  SOLN COMPARISON:  12/08/2003  FINDINGS: Lower chest: 3 mm right lower lobe nodule on image 1 of series 4 is stable in the long interval since prior study consistent with benign etiology. Hepatobiliary: No suspicious focal abnormality within the liver parenchyma. There is no evidence for gallstones, gallbladder wall thickening, or pericholecystic fluid. No intrahepatic or extrahepatic biliary dilation. Pancreas: No focal mass lesion. No dilatation of the main duct. No intraparenchymal cyst. No peripancreatic edema. Spleen: No splenomegaly. No focal mass lesion. Adrenals/Urinary Tract: No adrenal nodule or mass. Kidneys unremarkable. No evidence for hydroureter. The urinary bladder appears normal for the degree of distention. Stomach/Bowel: Tiny hiatal hernia. Stomach is decompressed. Duodenum is normally positioned as is the ligament of Treitz. Small bowel is nondilated. Terminal ileum is well seen on coronal image 52/5 and is positioned just anterior to the right psoas muscle on axial 55/2 with the ileocecal valve visible on image 56/2. The ascending and transverse colon is dilated and stool-filled in the colon remains distended and stool-filled down to the level of the sigmoid segment. This sigmoid segment is markedly elongated and extends up into the anterior right abdomen, anterior to the liver. The sigmoid segment is air-filled and dilated with no substantial stool volume. There is mesenteric twisting with 1 area of narrowing identified in the sigmoid colon in the right central abdomen (axial image 44/2) representing the distal sigmoid colon although a more proximal area of sigmoid colon narrowing is not evident. Distal sigmoid colon and rectum are fluid-filled. Vascular/Lymphatic: No abdominal aortic aneurysm. There is no gastrohepatic or hepatoduodenal ligament lymphadenopathy. No retroperitoneal or mesenteric lymphadenopathy. No pelvic sidewall lymphadenopathy. Reproductive: Adjacent cystic structures identified in the left adnexal space  measuring 3.6 cm in 3.0 cm. Both lesions have attenuation higher than would be expected for simple fluid. No right adnexal mass. IUD is visualized in the uterus. Other: No intraperitoneal free fluid. Musculoskeletal: No worrisome lytic or sclerotic osseous abnormality. IMPRESSION: 1. No evidence for small bowel obstruction. The colon is diffusely dilated with cecum measuring up to 7-8 cm diameter. Sigmoid colon measures up to 5 cm diameter. The sigmoid colon is markedly elongated and redundant, extending up into the right abdomen, anterior to the liver. There is an area of narrowing in the distal sigmoid colon as it enters the low pelvis with some associated mesenteric twisting, but a second area of sigmoid colon narrowing cannot be identified. There is no colonic wall thickening or associated pericolonic edema/intraperitoneal fluid. Imaging features may be related to marked colonic ileus/colonic dysmotility with the single area of narrowing bean related to sigmoid colon becoming non dependent towards the rectum in the low pelvis. Sigmoid volvulus is also a consideration, but not a definite CT finding. 2. Complex cystic lesions in the left adnexal space/ovary measuring up  to 3.6 cm. These may be hemorrhagic cysts. Pelvic ultrasound follow-up recommended to further evaluate. Electronically Signed   By: Kennith Center M.D.   On: 09/26/2020 14:32      Maddelyn Rocca L. Orvan Falconer, MD, MPH 09/26/2020, 8:11 PM

## 2020-09-26 NOTE — Progress Notes (Signed)
Dr. Patel at bedside to see patient.

## 2020-09-26 NOTE — ED Notes (Signed)
GI called back and stated they would prefer patient go to [facility] instead of [facility], MD notified.

## 2020-09-27 ENCOUNTER — Inpatient Hospital Stay (HOSPITAL_COMMUNITY): Payer: BLUE CROSS/BLUE SHIELD

## 2020-09-27 DIAGNOSIS — K5909 Other constipation: Secondary | ICD-10-CM

## 2020-09-27 DIAGNOSIS — K567 Ileus, unspecified: Secondary | ICD-10-CM

## 2020-09-27 DIAGNOSIS — K562 Volvulus: Secondary | ICD-10-CM

## 2020-09-27 LAB — BASIC METABOLIC PANEL
Anion gap: 9 (ref 5–15)
BUN: <5 mg/dL — ABNORMAL LOW (ref 6–20)
CO2: 22 mmol/L (ref 22–32)
Calcium: 9.1 mg/dL (ref 8.9–10.3)
Chloride: 106 mmol/L (ref 98–111)
Creatinine, Ser: 0.83 mg/dL (ref 0.44–1.00)
GFR, Estimated: >60 mL/min (ref >60)
Glucose, Bld: 85 mg/dL (ref 70–99)
Potassium: 3.9 mmol/L (ref 3.5–5.1)
Sodium: 137 mmol/L (ref 135–145)

## 2020-09-27 LAB — CBC
HCT: 40.7 % (ref 36.0–46.0)
Hemoglobin: 13.6 g/dL (ref 12.0–15.0)
MCH: 28.8 pg (ref 26.0–34.0)
MCHC: 33.4 g/dL (ref 30.0–36.0)
MCV: 86.2 fL (ref 80.0–100.0)
Platelets: 150 10*3/uL (ref 150–400)
RBC: 4.72 MIL/uL (ref 3.87–5.11)
RDW: 12.9 % (ref 11.5–15.5)
WBC: 8.3 10*3/uL (ref 4.0–10.5)
nRBC: 0 % (ref 0.0–0.2)

## 2020-09-27 LAB — HIV ANTIBODY (ROUTINE TESTING W REFLEX): HIV Screen 4th Generation wRfx: NONREACTIVE

## 2020-09-27 MED ORDER — PEG-KCL-NACL-NASULF-NA ASC-C 100 G PO SOLR
0.5000 | Freq: Once | ORAL | Status: AC
  Administered 2020-09-27: 100 g via ORAL
  Filled 2020-09-27: qty 1

## 2020-09-27 MED ORDER — POLYETHYLENE GLYCOL 3350 17 GM/SCOOP PO POWD
1.0000 | Freq: Once | ORAL | Status: AC
  Administered 2020-09-27: 255 g via ORAL
  Filled 2020-09-27: qty 255

## 2020-09-27 MED ORDER — PEG-KCL-NACL-NASULF-NA ASC-C 100 G PO SOLR
0.5000 | Freq: Once | ORAL | Status: DC
  Filled 2020-09-27: qty 1

## 2020-09-27 MED ORDER — BISACODYL 5 MG PO TBEC
10.0000 mg | DELAYED_RELEASE_TABLET | Freq: Four times a day (QID) | ORAL | Status: AC
  Administered 2020-09-27 (×2): 10 mg via ORAL
  Filled 2020-09-27 (×2): qty 2

## 2020-09-27 MED ORDER — PEG-KCL-NACL-NASULF-NA ASC-C 100 G PO SOLR: 1.0000 | Freq: Once | ORAL | Status: DC

## 2020-09-27 MED ORDER — BISACODYL 5 MG PO TBEC
20.0000 mg | DELAYED_RELEASE_TABLET | Freq: Once | ORAL | Status: AC
  Administered 2020-09-27: 20 mg via ORAL
  Filled 2020-09-27: qty 4

## 2020-09-27 MED ORDER — ACETAMINOPHEN 500 MG PO TABS
500.0000 mg | ORAL_TABLET | Freq: Four times a day (QID) | ORAL | Status: DC | PRN
  Administered 2020-09-27: 500 mg via ORAL
  Filled 2020-09-27: qty 1

## 2020-09-27 MED ORDER — METOCLOPRAMIDE HCL 5 MG/ML IJ SOLN
10.0000 mg | Freq: Once | INTRAMUSCULAR | Status: AC
  Administered 2020-09-27: 10 mg via INTRAVENOUS
  Filled 2020-09-27: qty 2

## 2020-09-27 MED ORDER — IOHEXOL 300 MG/ML  SOLN
450.0000 mL | Freq: Once | INTRAMUSCULAR | Status: AC | PRN
  Administered 2020-09-27: 600 mL via ORAL

## 2020-09-27 MED ORDER — METOCLOPRAMIDE HCL 5 MG/ML IJ SOLN
10.0000 mg | Freq: Once | INTRAMUSCULAR | Status: DC
  Filled 2020-09-27: qty 2

## 2020-09-27 NOTE — Progress Notes (Signed)
[facility] Progress Note     Subjective: CC-  Feels about the same as yesterday. Continues to have some intermittent crampy abdominal pain. Denies n/v.  WBC 8.3, afebrile.  Objective: Vital signs in last 24 hours: Temp:  [98.1 F (36.7 C)-98.9 F (37.2 C)] 98.6 F (37 C) (04/20 0510) Pulse Rate:  [62-79] 73 (04/20 0510) Resp:  [15-18] 17 (04/20 0510) BP: (117-146)/(72-93) 117/73 (04/20 0510) SpO2:  [97 %-100 %] 98 % (04/20 0510) Weight:  [74.8 kg] 74.8 kg (04/20 0510) Last BM Date: 09/19/20  Intake/Output from previous day: No intake/output data recorded. Intake/Output this shift: No intake/output data recorded.  PE: Gen:  Alert, NAD, pleasant Pulm:  rate and effort normal Abd: Soft, NT/ND, +BS, no HSM Psych: A&Ox4  Skin: no rashes noted, warm and dry  Lab Results:  Recent Labs    09/26/20 1215 09/27/20 0220  WBC 8.3 8.3  HGB 13.7 13.6  HCT 40.3 40.7  PLT 169 150   BMET Recent Labs    09/26/20 1215 09/27/20 0220  NA 136 137  K 5.1 3.9  CL 102 106  CO2 25 22  GLUCOSE 95 85  BUN 7 <5*  CREATININE 0.75 0.83  CALCIUM 9.6 9.1   PT/INR No results for input(s): LABPROT, INR in the last 72 hours. CMP     Component Value Date/Time   NA 137 09/27/2020 0220   K 3.9 09/27/2020 0220   CL 106 09/27/2020 0220   CO2 22 09/27/2020 0220   GLUCOSE 85 09/27/2020 0220   BUN <5 (L) 09/27/2020 0220   CREATININE 0.83 09/27/2020 0220   CALCIUM 9.1 09/27/2020 0220   PROT 6.9 09/26/2020 1215   ALBUMIN 4.5 09/26/2020 1215   AST 36 09/26/2020 1215   ALT 21 09/26/2020 1215   ALKPHOS 45 09/26/2020 1215   BILITOT 0.8 09/26/2020 1215   GFRNONAA >60 09/27/2020 0220   Lipase     Component Value Date/Time   LIPASE 36 09/26/2020 1215       Studies/Results: CT ABDOMEN PELVIS W CONTRAST  Result Date: 09/26/2020 CLINICAL DATA:  Abdominal pain.  Concern for bowel obstruction. EXAM: CT ABDOMEN AND PELVIS WITH CONTRAST TECHNIQUE: Multidetector CT imaging  of the abdomen and pelvis was performed using the standard protocol following bolus administration of intravenous contrast. CONTRAST:  17mL OMNIPAQUE IOHEXOL 300 MG/ML  SOLN COMPARISON:  12/08/2003 FINDINGS: Lower chest: 3 mm right lower lobe nodule on image 1 of series 4 is stable in the long interval since prior study consistent with benign etiology. Hepatobiliary: No suspicious focal abnormality within the liver parenchyma. There is no evidence for gallstones, gallbladder wall thickening, or pericholecystic fluid. No intrahepatic or extrahepatic biliary dilation. Pancreas: No focal mass lesion. No dilatation of the main duct. No intraparenchymal cyst. No peripancreatic edema. Spleen: No splenomegaly. No focal mass lesion. Adrenals/Urinary Tract: No adrenal nodule or mass. Kidneys unremarkable. No evidence for hydroureter. The urinary bladder appears normal for the degree of distention. Stomach/Bowel: Tiny hiatal hernia. Stomach is decompressed. Duodenum is normally positioned as is the ligament of Treitz. Small bowel is nondilated. Terminal ileum is well seen on coronal image 52/5 and is positioned just anterior to the right psoas muscle on axial 55/2 with the ileocecal valve visible on image 56/2. The ascending and transverse colon is dilated and stool-filled in the colon remains distended and stool-filled down to the level of the sigmoid segment. This sigmoid segment is markedly elongated and extends up into the anterior right abdomen, anterior  to the liver. The sigmoid segment is air-filled and dilated with no substantial stool volume. There is mesenteric twisting with 1 area of narrowing identified in the sigmoid colon in the right central abdomen (axial image 44/2) representing the distal sigmoid colon although a more proximal area of sigmoid colon narrowing is not evident. Distal sigmoid colon and rectum are fluid-filled. Vascular/Lymphatic: No abdominal aortic aneurysm. There is no gastrohepatic or  hepatoduodenal ligament lymphadenopathy. No retroperitoneal or mesenteric lymphadenopathy. No pelvic sidewall lymphadenopathy. Reproductive: Adjacent cystic structures identified in the left adnexal space measuring 3.6 cm in 3.0 cm. Both lesions have attenuation higher than would be expected for simple fluid. No right adnexal mass. IUD is visualized in the uterus. Other: No intraperitoneal free fluid. Musculoskeletal: No worrisome lytic or sclerotic osseous abnormality. IMPRESSION: 1. No evidence for small bowel obstruction. The colon is diffusely dilated with cecum measuring up to 7-8 cm diameter. Sigmoid colon measures up to 5 cm diameter. The sigmoid colon is markedly elongated and redundant, extending up into the right abdomen, anterior to the liver. There is an area of narrowing in the distal sigmoid colon as it enters the low pelvis with some associated mesenteric twisting, but a second area of sigmoid colon narrowing cannot be identified. There is no colonic wall thickening or associated pericolonic edema/intraperitoneal fluid. Imaging features may be related to marked colonic ileus/colonic dysmotility with the single area of narrowing bean related to sigmoid colon becoming non dependent towards the rectum in the low pelvis. Sigmoid volvulus is also a consideration, but not a definite CT finding. 2. Complex cystic lesions in the left adnexal space/ovary measuring up to 3.6 cm. These may be hemorrhagic cysts. Pelvic ultrasound follow-up recommended to further evaluate. Electronically Signed   By: Kennith Center M.D.   On: 09/26/2020 14:32   DG BE (COLON)W SINGLE CM (SOL OR THIN BA)  Result Date: 09/27/2020 CLINICAL DATA:  Constipation.  Abnormal CT.  Dilated sigmoid colon EXAM: WATER SOLUBLE CONTRAST ENEMA TECHNIQUE: Water-soluble enema performed with 600 mL Omnipaque 300 mixed with 2000 mL water. FLUOROSCOPY TIME:  Fluoroscopy Time:  5 minutes 42 second Radiation Exposure Index (if provided by the  fluoroscopic device): Number of Acquired Spot Images: 14 COMPARISON:  CT abdomen pelvis 09/26/2020 FINDINGS: Preliminary KUB demonstrates moderate amount of stool particularly in the sigmoid colon. Mild gaseous distention of the colon diffusely including the right colon. IUD noted. Normal rectum. Sigmoid colon is markedly redundant and dilated. Evaluation of the sigmoid colon was difficult with multiple positions including decubitus and prone positioning. There is a large amount of retained stool in the left colon. Eventually contrast progressed into the splenic flexure however the transverse colon and right colon were not opacified during the study despite multiple positional maneuvers. No mass lesion identified. No mucosal edema or diverticular change. No evidence of sigmoid volvulus. IMPRESSION: Markedly redundant and dilated sigmoid colon. Negative for sigmoid volvulus. Large amount of retained stool left colon. No obstructing mass lesion Transverse and right colon not evaluated due to markedly redundant colon and large volume colon. Question chronic colonic dysmotility. Electronically Signed   By: Marlan Palau M.D.   On: 09/27/2020 09:30    Anti-infectives: Anti-infectives (From admission, onward)   None       Assessment/Plan Hx ex lap at age 55 for fevers - findings were a large ovarian cyst and severe kidney infection Chronic constipation   Abdominal pain, constipation Dilated cecum/very redundant and dilated sigmoid colon - CT scan shows no evidence for  SBO; colon is diffusely dilated with cecum measuring up to 7-8 cm diameter; sigmoid colon is markedly elongated and redundant; there is an area of narrowing in the distal sigmoid colon as it enters the low pelvis with some associated mesenteric twisting; no colonic wall thickening or associated pericolonic edema/intraperitoneal fluid; ?colonic ileus/colonic dysmotility or sigmoid volvulus - WBC WNL, VSS, abdominal exam benign, no role for  acute surgical intervention - Water soluble barium enema today, will follow up once resulted. GI following and considering colonoscopy for further evaluation.   ID - none FEN - CLD VTE - SCDs, ok for chemical DVT prophylaxis from surgical standpoint Foley - none Follow up - TBD   LOS: 1 day    Franne Forts, [facility] 09/27/2020, 10:24 AM Please see Amion for pager number during day hours 7:00am-4:30pm

## 2020-09-27 NOTE — Progress Notes (Addendum)
Daily Rounding Note  09/27/2020, 10:23 AM  LOS: 1 day   SUBJECTIVE:   Chief complaint: Chronic constipation, abdominal bloating, pelvic pain. Still only passing mucus and air.  No stools for many days. Was hoping she be able to go home but not averse to staying in the hospital if necessary.  OBJECTIVE:         Vital signs in last 24 hours:    Temp:  [98.1 F (36.7 C)-98.9 F (37.2 C)] 98.6 F (37 C) (04/20 0510) Pulse Rate:  [62-79] 73 (04/20 0510) Resp:  [15-18] 17 (04/20 0510) BP: (117-146)/(72-93) 117/73 (04/20 0510) SpO2:  [97 %-100 %] 98 % (04/20 0510) Weight:  [74.8 kg] 74.8 kg (04/20 0510) Last BM Date: 09/19/20 Filed Weights   09/26/20 1153 09/27/20 0510  Weight: 74.8 kg 74.8 kg   General: Looks well.  Comfortable. Heart: RRR. Chest: No labored breathing or cough. Abdomen: Soft.  Minimal tenderness.  No HSM, masses, bruits, hernias.  Active bowel sounds. Extremities: No CCE. Neuro/Psych: Oriented x3.  Moves all 4 limbs.  No tremor.  No gross deficits.  Intake/Output from previous day: No intake/output data recorded.  Intake/Output this shift: No intake/output data recorded.  Lab Results: Recent Labs    09/26/20 1215 09/27/20 0220  WBC 8.3 8.3  HGB 13.7 13.6  HCT 40.3 40.7  PLT 169 150   BMET Recent Labs    09/26/20 1215 09/27/20 0220  NA 136 137  K 5.1 3.9  CL 102 106  CO2 25 22  GLUCOSE 95 85  BUN 7 <5*  CREATININE 0.75 0.83  CALCIUM 9.6 9.1   LFT Recent Labs    09/26/20 1215  PROT 6.9  ALBUMIN 4.5  AST 36  ALT 21  ALKPHOS 45  BILITOT 0.8   PT/INR No results for input(s): LABPROT, INR in the last 72 hours. Hepatitis Panel No results for input(s): HEPBSAG, HCVAB, HEPAIGM, HEPBIGM in the last 72 hours.  Studies/Results: CT ABDOMEN PELVIS W CONTRAST  Result Date: 09/26/2020 CLINICAL DATA:  Abdominal pain.  Concern for bowel obstruction. EXAM: CT ABDOMEN AND PELVIS WITH  CONTRAST TECHNIQUE: Multidetector CT imaging of the abdomen and pelvis was performed using the standard protocol following bolus administration of intravenous contrast. CONTRAST:  73mL OMNIPAQUE IOHEXOL 300 MG/ML  SOLN COMPARISON:  12/08/2003 FINDINGS: Lower chest: 3 mm right lower lobe nodule on image 1 of series 4 is stable in the long interval since prior study consistent with benign etiology. Hepatobiliary: No suspicious focal abnormality within the liver parenchyma. There is no evidence for gallstones, gallbladder wall thickening, or pericholecystic fluid. No intrahepatic or extrahepatic biliary dilation. Pancreas: No focal mass lesion. No dilatation of the main duct. No intraparenchymal cyst. No peripancreatic edema. Spleen: No splenomegaly. No focal mass lesion. Adrenals/Urinary Tract: No adrenal nodule or mass. Kidneys unremarkable. No evidence for hydroureter. The urinary bladder appears normal for the degree of distention. Stomach/Bowel: Tiny hiatal hernia. Stomach is decompressed. Duodenum is normally positioned as is the ligament of Treitz. Small bowel is nondilated. Terminal ileum is well seen on coronal image 52/5 and is positioned just anterior to the right psoas muscle on axial 55/2 with the ileocecal valve visible on image 56/2. The ascending and transverse colon is dilated and stool-filled in the colon remains distended and stool-filled down to the level of the sigmoid segment. This sigmoid segment is markedly elongated and extends up into the anterior right abdomen, anterior to the liver.  The sigmoid segment is air-filled and dilated with no substantial stool volume. There is mesenteric twisting with 1 area of narrowing identified in the sigmoid colon in the right central abdomen (axial image 44/2) representing the distal sigmoid colon although a more proximal area of sigmoid colon narrowing is not evident. Distal sigmoid colon and rectum are fluid-filled. Vascular/Lymphatic: No abdominal aortic  aneurysm. There is no gastrohepatic or hepatoduodenal ligament lymphadenopathy. No retroperitoneal or mesenteric lymphadenopathy. No pelvic sidewall lymphadenopathy. Reproductive: Adjacent cystic structures identified in the left adnexal space measuring 3.6 cm in 3.0 cm. Both lesions have attenuation higher than would be expected for simple fluid. No right adnexal mass. IUD is visualized in the uterus. Other: No intraperitoneal free fluid. Musculoskeletal: No worrisome lytic or sclerotic osseous abnormality. IMPRESSION: 1. No evidence for small bowel obstruction. The colon is diffusely dilated with cecum measuring up to 7-8 cm diameter. Sigmoid colon measures up to 5 cm diameter. The sigmoid colon is markedly elongated and redundant, extending up into the right abdomen, anterior to the liver. There is an area of narrowing in the distal sigmoid colon as it enters the low pelvis with some associated mesenteric twisting, but a second area of sigmoid colon narrowing cannot be identified. There is no colonic wall thickening or associated pericolonic edema/intraperitoneal fluid. Imaging features may be related to marked colonic ileus/colonic dysmotility with the single area of narrowing bean related to sigmoid colon becoming non dependent towards the rectum in the low pelvis. Sigmoid volvulus is also a consideration, but not a definite CT finding. 2. Complex cystic lesions in the left adnexal space/ovary measuring up to 3.6 cm. These may be hemorrhagic cysts. Pelvic ultrasound follow-up recommended to further evaluate. Electronically Signed   By: Kennith Center M.D.   On: 09/26/2020 14:32   DG BE (COLON)W SINGLE CM (SOL OR THIN BA)  Result Date: 09/27/2020 CLINICAL DATA:  Constipation.  Abnormal CT.  Dilated sigmoid colon EXAM: WATER SOLUBLE CONTRAST ENEMA TECHNIQUE: Water-soluble enema performed with 600 mL Omnipaque 300 mixed with 2000 mL water. FLUOROSCOPY TIME:  Fluoroscopy Time:  5 minutes 42 second Radiation  Exposure Index (if provided by the fluoroscopic device): Number of Acquired Spot Images: 14 COMPARISON:  CT abdomen pelvis 09/26/2020 FINDINGS: Preliminary KUB demonstrates moderate amount of stool particularly in the sigmoid colon. Mild gaseous distention of the colon diffusely including the right colon. IUD noted. Normal rectum. Sigmoid colon is markedly redundant and dilated. Evaluation of the sigmoid colon was difficult with multiple positions including decubitus and prone positioning. There is a large amount of retained stool in the left colon. Eventually contrast progressed into the splenic flexure however the transverse colon and right colon were not opacified during the study despite multiple positional maneuvers. No mass lesion identified. No mucosal edema or diverticular change. No evidence of sigmoid volvulus. IMPRESSION: Markedly redundant and dilated sigmoid colon. Negative for sigmoid volvulus. Large amount of retained stool left colon. No obstructing mass lesion Transverse and right colon not evaluated due to markedly redundant colon and large volume colon. Question chronic colonic dysmotility. Electronically Signed   By: Marlan Palau M.D.   On: 09/27/2020 09:30    ASSESMENT:   *    Severe chronic constipation. Despite MiraLAX and mag citrate, patient has not had a bowel movement for several days.  Passing some flatus and mucus.  No bloody output Failed colonoscopy 2015 due to poor prep. CTAP with dilated cecum, redundant, dilated sigmoid colon.  Point of narrowing in the distal sigmoid  but no definite sigmoid volvulus.  Left adnexal/ovarian complex cysts measuring up to 3.6 cm, possibly hemorrhagic cyst, recommend eval with pelvic ultrasound Single contrast BE confirms markedly redundant and dilated sigmoid colon.  No sigmoid volvulus.  Significant stool burden in left colon.  Transverse and right colon also markedly redundant and large volume. Unremarkable chemistries and CBC. 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  ?concurrent sigmoid volvulus   PLAN    *   Colonoscopy tmrw   Pt was hoping to go home. It is possible that she could be discharged home and have planned follow-up colonoscopy as an outpatient with a 2-day prep. In the meantime I have ordered a MiraLAX/Gatorade prep regimen along with a dose of Reglan to try to get her bowels cleared Clear liquids.    *   Complex adnexal/ovarian cyst, possibly hemorrhagic cyst.  Hospitalist planning outpatient follow-up with her GYN, Dr. Rosemary Holms who is also her PCP    Jennye Moccasin  09/27/2020, 10:23 AM    Attending physician's note   I have taken a history, examined the patient and reviewed the chart. I agree with the Advanced Practitioner's note, impression and recommendations.  55 year old female with chronic constipation, distended colon with dilated cecum and sigmoid colon.  She is passing flatus and is also having bowel movement ?  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 All questions answered. The patient agrees to proceed.    Iona Beard , MD [phone]   Phone [phone]

## 2020-09-27 NOTE — Progress Notes (Signed)
Pt refusing second prep.  Notified on-call [facility] provider, Mansouraty MD.  Provider aware.

## 2020-09-27 NOTE — Progress Notes (Signed)
PROGRESS NOTE  Dawn Potter  DOB: 1965/07/24  PCP: Brien Few, MD MAU:[id number]  DOA: 09/26/2020  LOS: 1 day   Chief Complaint  Patient presents with  . Abdominal Pain   Brief narrative: Dawn Potter is a 55 y.o. female with PMH significant for exploratory laparotomy 31 years ago with subsequent chronic constipation, bowel movement every 1 to 4 weeks. Patient presented to the ED on 4/19 with complaint of severe abdominal pain and constipation not relieving with MiraLAX, senna, docusate, and mag citrate. In the ED, she was hemodynamically stable Labs unremarkable   CT abdomen/pelvis with contrast showed changes concerning for colonic ileus with possible sigmoid volvulus.  Complex cystic lesions in the left adnexal space/ovary was also noted. Admitted to hospitalist service at [facility] and general surgery consultation were obtained.  Subjective: Patient was seen and examined this morning.  Pleasant release oxygen..  Sitting up in chair.  Not in distress. Chart reviewed Hemodynamically stable Labs unremarkable  Assessment/Plan: Acute abdominal pain in the setting of chronic severe constipation Possible sigmoid volvulus -GI and general surgery following. -Underwent barium enema this morning.  It did not show obstruction or volvulus.  Medical management for constipation planned.  GI plans for colonoscopy tomorrow. -Continue IV fluid, antiemetics  Left adnexal complex cystic lesions: -CT abdomen on admission showed a complex cystic lesion in the left adnexa of 3.6 cm.   -Remote CT pelvis in 2005 showed a 7 cm diameter cyst in the left adnexa.   -Recommend pelvic ultrasound as an outpatient.  Mobility: Encourage ambulation Code Status:   Code Status: Full Code  Nutritional status: Body mass index is 25.09 kg/m.     Diet Order            Diet NPO time specified  Diet effective 0500 tomorrow           Diet clear liquid Room service appropriate? Yes;  Fluid consistency: Thin  Diet effective now                 DVT prophylaxis: SCDs Start: 09/26/20 1856   Antimicrobials:  None Fluid: None Consultants: GI, surgery Family Communication:  Husband at bedside  Status is: Inpatient  Remains inpatient appropriate because: Plan for colonoscopy tomorrow.  Dispo: The patient is from: Home              Anticipated d/c is to: Home              Patient currently is not medically stable to d/c.   Difficult to place patient No       Infusions:    Scheduled Meds: . bisacodyl  10 mg Oral Q6H  . metoCLOPramide (REGLAN) injection  10 mg Intravenous Once   Followed by  . metoCLOPramide (REGLAN) injection  10 mg Intravenous Once  . peg 3350 powder  0.5 kit Oral Once   And  . peg 3350 powder  0.5 kit Oral Once    Antimicrobials: Anti-infectives (From admission, onward)   None      PRN meds: acetaminophen, morphine injection, ondansetron **OR** ondansetron (ZOFRAN) IV   Objective: Vitals:   09/27/20 0510 09/27/20 1607  BP: 117/73 122/78  Pulse: 73 70  Resp: 17   Temp: 98.6 F (37 C) 98.1 F (36.7 C)  SpO2: 98% 100%    Intake/Output Summary (Last 24 hours) at 09/27/2020 1710 Last data filed at 09/27/2020 1611 Gross per 24 hour  Intake 3250.15 ml  Output --  Net 3250.15  ml   Filed Weights   09/26/20 1153 09/27/20 0510  Weight: 74.8 kg 74.8 kg   Weight change:  Body mass index is 25.09 kg/m.   Physical Exam: General exam: Pleasant, middle-aged Caucasian female.  Not in distress Skin: No rashes, lesions or ulcers. HEENT: Atraumatic, normocephalic, no obvious bleeding Lungs: Clear to auscultation bilaterally CVS: Regular rate and rhythm, no murmur GI/Abd soft, nontender, nondistended, bowel sound present CNS: Alert, awake, oriented x3 Psychiatry: Mood appropriate Extremities: No pedal edema, no calf tenderness  Data Review: I have personally reviewed the laboratory data and studies available.  Recent Labs   Lab 09/26/20 1215 09/27/20 0220  WBC 8.3 8.3  HGB 13.7 13.6  HCT 40.3 40.7  MCV 84.5 86.2  PLT 169 150   Recent Labs  Lab 09/26/20 1215 09/27/20 0220  NA 136 137  K 5.1 3.9  CL 102 106  CO2 25 22  GLUCOSE 95 85  BUN 7 <5*  CREATININE 0.75 0.83  CALCIUM 9.6 9.1    F/u labs ordered Unresulted Labs (From admission, onward)         None      Signed, Terrilee Croak, MD [facility] 09/27/2020

## 2020-09-28 MED ORDER — METOCLOPRAMIDE HCL 5 MG/ML IJ SOLN
10.0000 mg | Freq: Once | INTRAMUSCULAR | Status: AC
  Administered 2020-09-28: 10 mg via INTRAVENOUS
  Filled 2020-09-28: qty 2

## 2020-09-28 MED ORDER — POLYETHYLENE GLYCOL 3350 17 GM/SCOOP PO POWD
1.0000 | Freq: Once | ORAL | Status: AC
  Administered 2020-09-28: 255 g via ORAL
  Filled 2020-09-28: qty 255

## 2020-09-28 NOTE — Progress Notes (Signed)
Spoke with [facility] On-Call provider, Marina Goodell MD.  Pt not clear at this time and Colonoscopy scheduled for 11:15 tomorrow morning. Ordered another dose of Miralax prep and pt to be NPO at 0700 per provider order.

## 2020-09-28 NOTE — H&P (View-Only) (Signed)
   Progress Note  Chief Complaint:    Constipation, abnormal CT scan   Attending physician's note   I have taken an interval history, reviewed the chart and examined the patient. I agree with the Advanced Practitioner's note, impression and recommendations.   She did not tolerate movie prep, will do MiraLAX bowel prep and plan for colonoscopy tomorrow The risks and benefits as well as alternatives of endoscopic procedure(s) have been discussed and reviewed. All questions answered. The patient agrees to proceed.    K. Veena Dawson Hollman , MD [phone]         ASSESSMENT / PLAN:    # 54 yo female with chronic constipation presenting with abdominal pain, bloating and CT scan showing dilated colon with significant stool burden, no obstruction. Likely has colonic inertia / ? Pelvic floor dysfunction. Plan was for colonoscopy today but she could not tolerate second part of Moviprep due to vomiting. She is having some BMs this am.  --Rescheduled colonoscopy for tomorrow am. Patient says she can tolerate Miralax. Will give a miralax prep today and another this am. Reglan IV prior to starting each prep.      SUBJECTIVE:   Only tolerating first part of moviprep. Having some loose stools. Generally takes laxatives at home.    OBJECTIVE:    Scheduled inpatient medications:  . metoCLOPramide (REGLAN) injection  10 mg Intravenous Once  . peg 3350 powder  0.5 kit Oral Once   Continuous inpatient infusions:  PRN inpatient medications: acetaminophen, morphine injection, ondansetron **OR** ondansetron (ZOFRAN) IV  Vital signs in last 24 hours: Temp:  [98.1 F (36.7 C)-98.6 F (37 C)] 98.6 F (37 C) (04/21 0537) Pulse Rate:  [70-75] 74 (04/21 0537) Resp:  [16-17] 17 (04/21 0537) BP: (105-126)/(67-78) 126/75 (04/21 0537) SpO2:  [97 %-100 %] 98 % (04/21 0537) Last BM Date: 09/19/20  Intake/Output Summary (Last 24 hours) at 09/28/2020 0944 Last data filed at 09/27/2020 1611 Gross per  24 hour  Intake 3250.15 ml  Output --  Net 3250.15 ml     Physical Exam:  . General: Alert female in NAD . Heart:  Regular rate and rhythm. No lower extremity edema . Pulmonary: Normal respiratory effort . Abdomen: Soft, nondistended, nontender. Normal bowel sounds.  . Neurologic: Alert and oriented . Psych: Pleasant. Cooperative.   Filed Weights   09/26/20 1153 09/27/20 0510  Weight: 74.8 kg 74.8 kg    Intake/Output from previous day: 04/20 0701 - 04/21 0700 In: 3250.2 [P.O.:2640; I.V.:610.2] Out: -  Intake/Output this shift: No intake/output data recorded.    Lab Results: Recent Labs    09/26/20 1215 09/27/20 0220  WBC 8.3 8.3  HGB 13.7 13.6  HCT 40.3 40.7  PLT 169 150   BMET Recent Labs    09/26/20 1215 09/27/20 0220  NA 136 137  K 5.1 3.9  CL 102 106  CO2 25 22  GLUCOSE 95 85  BUN 7 <5*  CREATININE 0.75 0.83  CALCIUM 9.6 9.1   LFT Recent Labs    09/26/20 1215  PROT 6.9  ALBUMIN 4.5  AST 36  ALT 21  ALKPHOS 45  BILITOT 0.8   PT/INR No results for input(s): LABPROT, INR in the last 72 hours. Hepatitis Panel No results for input(s): HEPBSAG, HCVAB, HEPAIGM, HEPBIGM in the last 72 hours.  CT ABDOMEN PELVIS W CONTRAST  Result Date: 09/26/2020 CLINICAL DATA:  Abdominal pain.  Concern for bowel obstruction. EXAM: CT ABDOMEN AND PELVIS WITH CONTRAST TECHNIQUE: Multidetector CT   imaging of the abdomen and pelvis was performed using the standard protocol following bolus administration of intravenous contrast. CONTRAST:  80mL OMNIPAQUE IOHEXOL 300 MG/ML  SOLN COMPARISON:  12/08/2003 FINDINGS: Lower chest: 3 mm right lower lobe nodule on image 1 of series 4 is stable in the long interval since prior study consistent with benign etiology. Hepatobiliary: No suspicious focal abnormality within the liver parenchyma. There is no evidence for gallstones, gallbladder wall thickening, or pericholecystic fluid. No intrahepatic or extrahepatic biliary dilation.  Pancreas: No focal mass lesion. No dilatation of the main duct. No intraparenchymal cyst. No peripancreatic edema. Spleen: No splenomegaly. No focal mass lesion. Adrenals/Urinary Tract: No adrenal nodule or mass. Kidneys unremarkable. No evidence for hydroureter. The urinary bladder appears normal for the degree of distention. Stomach/Bowel: Tiny hiatal hernia. Stomach is decompressed. Duodenum is normally positioned as is the ligament of Treitz. Small bowel is nondilated. Terminal ileum is well seen on coronal image 52/5 and is positioned just anterior to the right psoas muscle on axial 55/2 with the ileocecal valve visible on image 56/2. The ascending and transverse colon is dilated and stool-filled in the colon remains distended and stool-filled down to the level of the sigmoid segment. This sigmoid segment is markedly elongated and extends up into the anterior right abdomen, anterior to the liver. The sigmoid segment is air-filled and dilated with no substantial stool volume. There is mesenteric twisting with 1 area of narrowing identified in the sigmoid colon in the right central abdomen (axial image 44/2) representing the distal sigmoid colon although a more proximal area of sigmoid colon narrowing is not evident. Distal sigmoid colon and rectum are fluid-filled. Vascular/Lymphatic: No abdominal aortic aneurysm. There is no gastrohepatic or hepatoduodenal ligament lymphadenopathy. No retroperitoneal or mesenteric lymphadenopathy. No pelvic sidewall lymphadenopathy. Reproductive: Adjacent cystic structures identified in the left adnexal space measuring 3.6 cm in 3.0 cm. Both lesions have attenuation higher than would be expected for simple fluid. No right adnexal mass. IUD is visualized in the uterus. Other: No intraperitoneal free fluid. Musculoskeletal: No worrisome lytic or sclerotic osseous abnormality. IMPRESSION: 1. No evidence for small bowel obstruction. The colon is diffusely dilated with cecum  measuring up to 7-8 cm diameter. Sigmoid colon measures up to 5 cm diameter. The sigmoid colon is markedly elongated and redundant, extending up into the right abdomen, anterior to the liver. There is an area of narrowing in the distal sigmoid colon as it enters the low pelvis with some associated mesenteric twisting, but a second area of sigmoid colon narrowing cannot be identified. There is no colonic wall thickening or associated pericolonic edema/intraperitoneal fluid. Imaging features may be related to marked colonic ileus/colonic dysmotility with the single area of narrowing bean related to sigmoid colon becoming non dependent towards the rectum in the low pelvis. Sigmoid volvulus is also a consideration, but not a definite CT finding. 2. Complex cystic lesions in the left adnexal space/ovary measuring up to 3.6 cm. These may be hemorrhagic cysts. Pelvic ultrasound follow-up recommended to further evaluate. Electronically Signed   By: Eric  Mansell M.D.   On: 09/26/2020 14:32   DG BE (COLON)W SINGLE CM (SOL OR THIN BA)  Result Date: 09/27/2020 CLINICAL DATA:  Constipation.  Abnormal CT.  Dilated sigmoid colon EXAM: WATER SOLUBLE CONTRAST ENEMA TECHNIQUE: Water-soluble enema performed with 600 mL Omnipaque 300 mixed with 2000 mL water. FLUOROSCOPY TIME:  Fluoroscopy Time:  5 minutes 42 second Radiation Exposure Index (if provided by the fluoroscopic device): Number of Acquired Spot   Images: 14 COMPARISON:  CT abdomen pelvis 09/26/2020 FINDINGS: Preliminary KUB demonstrates moderate amount of stool particularly in the sigmoid colon. Mild gaseous distention of the colon diffusely including the right colon. IUD noted. Normal rectum. Sigmoid colon is markedly redundant and dilated. Evaluation of the sigmoid colon was difficult with multiple positions including decubitus and prone positioning. There is a large amount of retained stool in the left colon. Eventually contrast progressed into the splenic flexure  however the transverse colon and right colon were not opacified during the study despite multiple positional maneuvers. No mass lesion identified. No mucosal edema or diverticular change. No evidence of sigmoid volvulus. IMPRESSION: Markedly redundant and dilated sigmoid colon. Negative for sigmoid volvulus. Large amount of retained stool left colon. No obstructing mass lesion Transverse and right colon not evaluated due to markedly redundant colon and large volume colon. Question chronic colonic dysmotility. Electronically Signed   By: Charles  Clark M.D.   On: 09/27/2020 09:30    Active Problems:   Ileus (HCC)   Chronic constipation   Volvulus of sigmoid colon (HCC)     LOS: 2 days   Paula Guenther ,NP 09/28/2020, 9:44 AM       

## 2020-09-28 NOTE — Plan of Care (Signed)

## 2020-09-28 NOTE — Progress Notes (Signed)
PROGRESS NOTE  Dawn Potter  DOB: 04-14-1966  PCP: Brien Few, MD BWG:[id number]  DOA: 09/26/2020  LOS: 2 days   Chief Complaint  Patient presents with  . Abdominal Pain   Brief narrative: Dawn Potter is a 55 y.o. female with PMH significant for exploratory laparotomy 31 years ago with subsequent chronic constipation, bowel movement every 1 to 4 weeks. Patient presented to the ED on 4/19 with complaint of severe abdominal pain and constipation not relieving with MiraLAX, senna, docusate, and mag citrate. In the ED, she was hemodynamically stable Labs unremarkable   CT abdomen/pelvis with contrast showed changes concerning for colonic ileus with possible sigmoid volvulus.  Complex cystic lesions in the left adnexal space/ovary was also noted. Admitted to hospitalist service at [facility] and general surgery consultation were obtained.  Subjective: Patient was seen and examined this morning. Walking around in the room.  Not in distress.  Did not have a good bowel preparation today so could not undergo colonoscopy.  Noted a plan to continue to prep and perform a colonoscopy tomorrow.  Assessment/Plan: Acute abdominal pain in the setting of chronic severe constipation Possible sigmoid volvulus -GI and general surgery following. -Underwent barium enema on 4/20.  It did not show obstruction or volvulus.  Medical management for constipation planned.  GI planned for colonoscopy today but was not because of inadequate prep.  Postponed for tomorrow. -Continue IV fluid, antiemetics  Left adnexal complex cystic lesions: -CT abdomen on admission showed a complex cystic lesion in the left adnexa of 3.6 cm.   -Remote CT pelvis in 2005 showed a 7 cm diameter cyst in the left adnexa.   -Recommend pelvic ultrasound as an outpatient.  Mobility: Encourage ambulation Code Status:   Code Status: Full Code  Nutritional status: Body mass index is 25.09 kg/m.     Diet Order             Diet NPO time specified  Diet effective midnight           Diet clear liquid Room service appropriate? Yes; Fluid consistency: Thin  Diet effective now                 DVT prophylaxis: SCDs Start: 09/26/20 1856   Antimicrobials:  None Fluid: None Consultants: GI, surgery Family Communication:  Husband at bedside  Status is: Inpatient  Remains inpatient appropriate because: Colonoscopy planned for tomorrow  Dispo: The patient is from: Home              Anticipated d/c is to: Home in 1 to 2 days              Patient currently is not medically stable to d/c.   Difficult to place patient No       Infusions:    Scheduled Meds: . metoCLOPramide (REGLAN) injection  10 mg Intravenous Once  . metoCLOPramide (REGLAN) injection  10 mg Intravenous Once  . peg 3350 powder  0.5 kit Oral Once  . polyethylene glycol powder  1 Container Oral Once    Antimicrobials: Anti-infectives (From admission, onward)   None      PRN meds: acetaminophen, morphine injection, ondansetron **OR** ondansetron (ZOFRAN) IV   Objective: Vitals:   09/27/20 2231 09/28/20 0537  BP: 105/67 126/75  Pulse: 74 74  Resp: 16 17  Temp: 98.5 F (36.9 C) 98.6 F (37 C)  SpO2: 97% 98%    Intake/Output Summary (Last 24 hours) at 09/28/2020 1326 Last data filed  at 09/27/2020 1611 Gross per 24 hour  Intake 1800 ml  Output --  Net 1800 ml   Filed Weights   09/26/20 1153 09/27/20 0510  Weight: 74.8 kg 74.8 kg   Weight change:  Body mass index is 25.09 kg/m.   Physical Exam: General exam: Pleasant, middle-aged Caucasian female.  Not in distress Skin: No rashes, lesions or ulcers. HEENT: Atraumatic, normocephalic, no obvious bleeding Lungs: Clear to auscultation bilaterally CVS: Regular rate and rhythm, no murmur GI/Abd soft, nontender, nondistended, bowel sound present CNS: Alert, awake, oriented x3 Psychiatry: Mood appropriate Extremities: No pedal edema, no calf  tenderness  Data Review: I have personally reviewed the laboratory data and studies available.  Recent Labs  Lab 09/26/20 1215 09/27/20 0220  WBC 8.3 8.3  HGB 13.7 13.6  HCT 40.3 40.7  MCV 84.5 86.2  PLT 169 150   Recent Labs  Lab 09/26/20 1215 09/27/20 0220  NA 136 137  K 5.1 3.9  CL 102 106  CO2 25 22  GLUCOSE 95 85  BUN 7 <5*  CREATININE 0.75 0.83  CALCIUM 9.6 9.1    F/u labs ordered Unresulted Labs (From admission, onward)         None      Signed, Terrilee Croak, MD [facility] 09/28/2020

## 2020-09-28 NOTE — Progress Notes (Addendum)
Progress Note  Chief Complaint:    Constipation, abnormal CT scan   Attending physician's note   I have taken an interval history, reviewed the chart and examined the patient. I agree with the Advanced Practitioner's note, impression and recommendations.   She did not tolerate movie prep, will do MiraLAX bowel prep and plan for colonoscopy tomorrow The risks and benefits as well as alternatives of endoscopic procedure(s) have been discussed and reviewed. All questions answered. The patient agrees to proceed.    Dawn Potter , MD [phone]         ASSESSMENT / PLAN:    # 55 yo female with chronic constipation presenting with abdominal pain, bloating and CT scan showing dilated colon with significant stool burden, no obstruction. Likely has colonic inertia / ? Pelvic floor dysfunction. Plan was for colonoscopy today but she could not tolerate second part of Moviprep due to vomiting. She is having some BMs this am.  --Rescheduled colonoscopy for tomorrow am. Patient says she can tolerate Miralax. Will give a miralax prep today and another this am. Reglan IV prior to starting each prep.      SUBJECTIVE:   Only tolerating first part of moviprep. Having some loose stools. Generally takes laxatives at home.    OBJECTIVE:    Scheduled inpatient medications:  . metoCLOPramide (REGLAN) injection  10 mg Intravenous Once  . peg 3350 powder  0.5 kit Oral Once   Continuous inpatient infusions:  PRN inpatient medications: acetaminophen, morphine injection, ondansetron **OR** ondansetron (ZOFRAN) IV  Vital signs in last 24 hours: Temp:  [98.1 F (36.7 C)-98.6 F (37 C)] 98.6 F (37 C) (04/21 0537) Pulse Rate:  [70-75] 74 (04/21 0537) Resp:  [16-17] 17 (04/21 0537) BP: (105-126)/(67-78) 126/75 (04/21 0537) SpO2:  [97 %-100 %] 98 % (04/21 0537) Last BM Date: 09/19/20  Intake/Output Summary (Last 24 hours) at 09/28/2020 0944 Last data filed at 09/27/2020 1611 Gross per  24 hour  Intake 3250.15 ml  Output --  Net 3250.15 ml     Physical Exam:  . General: Alert female in NAD . Heart:  Regular rate and rhythm. No lower extremity edema . Pulmonary: Normal respiratory effort . Abdomen: Soft, nondistended, nontender. Normal bowel sounds.  . Neurologic: Alert and oriented . Psych: Pleasant. Cooperative.   Filed Weights   09/26/20 1153 09/27/20 0510  Weight: 74.8 kg 74.8 kg    Intake/Output from previous day: 04/20 0701 - 04/21 0700 In: 3250.2 [P.O.:2640; I.V.:610.2] Out: -  Intake/Output this shift: No intake/output data recorded.    Lab Results: Recent Labs    09/26/20 1215 09/27/20 0220  WBC 8.3 8.3  HGB 13.7 13.6  HCT 40.3 40.7  PLT 169 150   BMET Recent Labs    09/26/20 1215 09/27/20 0220  NA 136 137  K 5.1 3.9  CL 102 106  CO2 25 22  GLUCOSE 95 85  BUN 7 <5*  CREATININE 0.75 0.83  CALCIUM 9.6 9.1   LFT Recent Labs    09/26/20 1215  PROT 6.9  ALBUMIN 4.5  AST 36  ALT 21  ALKPHOS 45  BILITOT 0.8   PT/INR No results for input(s): LABPROT, INR in the last 72 hours. Hepatitis Panel No results for input(s): HEPBSAG, HCVAB, HEPAIGM, HEPBIGM in the last 72 hours.  CT ABDOMEN PELVIS W CONTRAST  Result Date: 09/26/2020 CLINICAL DATA:  Abdominal pain.  Concern for bowel obstruction. EXAM: CT ABDOMEN AND PELVIS WITH CONTRAST TECHNIQUE: Multidetector CT  imaging of the abdomen and pelvis was performed using the standard protocol following bolus administration of intravenous contrast. CONTRAST:  68m OMNIPAQUE IOHEXOL 300 MG/ML  SOLN COMPARISON:  12/08/2003 FINDINGS: Lower chest: 3 mm right lower lobe nodule on image 1 of series 4 is stable in the long interval since prior study consistent with benign etiology. Hepatobiliary: No suspicious focal abnormality within the liver parenchyma. There is no evidence for gallstones, gallbladder wall thickening, or pericholecystic fluid. No intrahepatic or extrahepatic biliary dilation.  Pancreas: No focal mass lesion. No dilatation of the main duct. No intraparenchymal cyst. No peripancreatic edema. Spleen: No splenomegaly. No focal mass lesion. Adrenals/Urinary Tract: No adrenal nodule or mass. Kidneys unremarkable. No evidence for hydroureter. The urinary bladder appears normal for the degree of distention. Stomach/Bowel: Tiny hiatal hernia. Stomach is decompressed. Duodenum is normally positioned as is the ligament of Treitz. Small bowel is nondilated. Terminal ileum is well seen on coronal image 52/5 and is positioned just anterior to the right psoas muscle on axial 55/2 with the ileocecal valve visible on image 56/2. The ascending and transverse colon is dilated and stool-filled in the colon remains distended and stool-filled down to the level of the sigmoid segment. This sigmoid segment is markedly elongated and extends up into the anterior right abdomen, anterior to the liver. The sigmoid segment is air-filled and dilated with no substantial stool volume. There is mesenteric twisting with 1 area of narrowing identified in the sigmoid colon in the right central abdomen (axial image 44/2) representing the distal sigmoid colon although a more proximal area of sigmoid colon narrowing is not evident. Distal sigmoid colon and rectum are fluid-filled. Vascular/Lymphatic: No abdominal aortic aneurysm. There is no gastrohepatic or hepatoduodenal ligament lymphadenopathy. No retroperitoneal or mesenteric lymphadenopathy. No pelvic sidewall lymphadenopathy. Reproductive: Adjacent cystic structures identified in the left adnexal space measuring 3.6 cm in 3.0 cm. Both lesions have attenuation higher than would be expected for simple fluid. No right adnexal mass. IUD is visualized in the uterus. Other: No intraperitoneal free fluid. Musculoskeletal: No worrisome lytic or sclerotic osseous abnormality. IMPRESSION: 1. No evidence for small bowel obstruction. The colon is diffusely dilated with cecum  measuring up to 7-8 cm diameter. Sigmoid colon measures up to 5 cm diameter. The sigmoid colon is markedly elongated and redundant, extending up into the right abdomen, anterior to the liver. There is an area of narrowing in the distal sigmoid colon as it enters the low pelvis with some associated mesenteric twisting, but a second area of sigmoid colon narrowing cannot be identified. There is no colonic wall thickening or associated pericolonic edema/intraperitoneal fluid. Imaging features may be related to marked colonic ileus/colonic dysmotility with the single area of narrowing bean related to sigmoid colon becoming non dependent towards the rectum in the low pelvis. Sigmoid volvulus is also a consideration, but not a definite CT finding. 2. Complex cystic lesions in the left adnexal space/ovary measuring up to 3.6 cm. These may be hemorrhagic cysts. Pelvic ultrasound follow-up recommended to further evaluate. Electronically Signed   By: EMisty StanleyM.D.   On: 09/26/2020 14:32   DG BE (COLON)W SINGLE CM (SOL OR THIN BA)  Result Date: 09/27/2020 CLINICAL DATA:  Constipation.  Abnormal CT.  Dilated sigmoid colon EXAM: WATER SOLUBLE CONTRAST ENEMA TECHNIQUE: Water-soluble enema performed with 600 mL Omnipaque 300 mixed with 2000 mL water. FLUOROSCOPY TIME:  Fluoroscopy Time:  5 minutes 42 second Radiation Exposure Index (if provided by the fluoroscopic device): Number of Acquired Spot  Images: 14 COMPARISON:  CT abdomen pelvis 09/26/2020 FINDINGS: Preliminary KUB demonstrates moderate amount of stool particularly in the sigmoid colon. Mild gaseous distention of the colon diffusely including the right colon. IUD noted. Normal rectum. Sigmoid colon is markedly redundant and dilated. Evaluation of the sigmoid colon was difficult with multiple positions including decubitus and prone positioning. There is a large amount of retained stool in the left colon. Eventually contrast progressed into the splenic flexure  however the transverse colon and right colon were not opacified during the study despite multiple positional maneuvers. No mass lesion identified. No mucosal edema or diverticular change. No evidence of sigmoid volvulus. IMPRESSION: Markedly redundant and dilated sigmoid colon. Negative for sigmoid volvulus. Large amount of retained stool left colon. No obstructing mass lesion Transverse and right colon not evaluated due to markedly redundant colon and large volume colon. Question chronic colonic dysmotility. Electronically Signed   By: Franchot Gallo M.D.   On: 09/27/2020 09:30    Active Problems:   Ileus (HCC)   Chronic constipation   Volvulus of sigmoid colon ([place])     LOS: 2 days   Dawn Potter ,NP 09/28/2020, 9:44 AM

## 2020-09-29 ENCOUNTER — Encounter (HOSPITAL_COMMUNITY)
Admission: EM | Disposition: A | Discharge status: Home / Self Care | Payer: Self-pay | Source: Home / Self Care | Attending: Internal Medicine

## 2020-09-29 ENCOUNTER — Encounter (HOSPITAL_COMMUNITY): Payer: Self-pay | Admitting: Internal Medicine

## 2020-09-29 ENCOUNTER — Inpatient Hospital Stay (HOSPITAL_COMMUNITY): Payer: BLUE CROSS/BLUE SHIELD

## 2020-09-29 ENCOUNTER — Inpatient Hospital Stay (HOSPITAL_COMMUNITY): Payer: BLUE CROSS/BLUE SHIELD | Admitting: Certified Registered Nurse Anesthetist

## 2020-09-29 DIAGNOSIS — K5909 Other constipation: Secondary | ICD-10-CM

## 2020-09-29 DIAGNOSIS — K5939 Other megacolon: Secondary | ICD-10-CM

## 2020-09-29 HISTORY — PX: COLONOSCOPY WITH PROPOFOL: SHX5780

## 2020-09-29 SURGERY — COLONOSCOPY WITH PROPOFOL
Anesthesia: Monitor Anesthesia Care

## 2020-09-29 MED ORDER — MAGIC MOUTHWASH W/LIDOCAINE: 10.0000 mL | Freq: Four times a day (QID) | ORAL | 0 refills | Status: AC | PRN

## 2020-09-29 MED ORDER — HYOSCYAMINE SULFATE 0.125 MG SL SUBL
0.1250 mg | SUBLINGUAL_TABLET | Freq: Four times a day (QID) | SUBLINGUAL | Status: DC | PRN
  Filled 2020-09-29: qty 1

## 2020-09-29 MED ORDER — MAGIC MOUTHWASH W/LIDOCAINE
10.0000 mL | Freq: Four times a day (QID) | ORAL | Status: DC
  Administered 2020-09-29 (×2): 10 mL via ORAL
  Filled 2020-09-29 (×3): qty 10

## 2020-09-29 MED ORDER — BENEFIBER DRINK MIX PO PACK: 1.0000 | PACK | Freq: Three times a day (TID) | ORAL | 0 refills | Status: DC

## 2020-09-29 MED ORDER — LACTATED RINGERS IV SOLN: INTRAVENOUS | Status: DC | PRN

## 2020-09-29 MED ORDER — PROPOFOL 500 MG/50ML IV EMUL
INTRAVENOUS | Status: DC | PRN
  Administered 2020-09-29: 150 ug/kg/min via INTRAVENOUS

## 2020-09-29 MED ORDER — PROPOFOL 10 MG/ML IV BOLUS
INTRAVENOUS | Status: DC | PRN
  Administered 2020-09-29 (×3): 30 mg via INTRAVENOUS

## 2020-09-29 MED ORDER — AMOXICILLIN-POT CLAVULANATE 875-125 MG PO TABS: 1.0000 | ORAL_TABLET | Freq: Two times a day (BID) | ORAL | 0 refills | Status: AC

## 2020-09-29 MED ORDER — HYOSCYAMINE SULFATE SL 0.125 MG SL SUBL: 1.0000 | SUBLINGUAL_TABLET | Freq: Four times a day (QID) | SUBLINGUAL | 2 refills | Status: AC | PRN

## 2020-09-29 MED ORDER — POLYETHYLENE GLYCOL 3350 17 G PO PACK: 17.0000 g | PACK | Freq: Three times a day (TID) | ORAL | 0 refills | Status: AC

## 2020-09-29 SURGICAL SUPPLY — 22 items

## 2020-09-29 NOTE — Op Note (Signed)
[facility] Patient Name: Dawn Potter Procedure Date : 09/29/2020 MRN: [id number] Attending MD: Napoleon Form , MD Date of Birth: June 01, 1966 CSN: [id number] Age: 55 Admit Type: Inpatient Procedure:                Colonoscopy Indications:              Generalized abdominal pain, Abnormal CT of the GI                            tract, ?chronic intermittent sigmoid volvulus.                            Constipation Providers:                Napoleon Form, MD, Tillie Fantasia, RN, Beryle Beams, Technician, Brion Aliment, Technician,                            Little Ishikawa, CRNA Referring MD:             Alvira Monday Medicines:                Monitored Anesthesia Care Complications:            No immediate complications. Estimated Blood Loss:     Estimated blood loss: none. Procedure:                Pre-Anesthesia Assessment:                           - Prior to the procedure, a History and Physical                            was performed, and patient medications and                            allergies were reviewed. The patient's tolerance of                            previous anesthesia was also reviewed. The risks                            and benefits of the procedure and the sedation                            options and risks were discussed with the patient.                            All questions were answered, and informed consent                            was obtained. Prior Anticoagulants: The patient has                            taken no previous  anticoagulant or antiplatelet                            agents. ASA Grade Assessment: II - A patient with                            mild systemic disease. After reviewing the risks                            and benefits, the patient was deemed in                            satisfactory condition to undergo the procedure.                           After obtaining  informed consent, the colonoscope                            was passed under direct vision. Throughout the                            procedure, the patient's blood pressure, pulse, and                            oxygen saturations were monitored continuously. The                            PCF-H190DL ([id number] Olympus pediatric colonscope                            was introduced through the anus and advanced to the                            the hepatic flexure. The colonoscopy was performed                            with difficulty due to a redundant colon and a                            tortuous colon. Successful completion of the                            procedure was aided by applying abdominal pressure.                            The patient tolerated the procedure well. The                            quality of the bowel preparation was excellent. The                            rectum was photographed. Scope In: 11:11:41 AM Scope Out: 12:16:28 PM Total Procedure Duration: 1 hour 4 minutes 46 seconds  Findings:  The perianal and digital rectal examinations were normal.      The lumen of the colon (entire examined portion) was significantly       dilated, tortous and redundat. Mucosa otherwise appeared healthy. No       mass lesions or polyps in the visualized portion of colon      Non-bleeding external and internal hemorrhoids were found during       retroflexion. The hemorrhoids were medium-sized. Impression:               - Dilated in the entire examined colon.                           - Non-bleeding external and internal hemorrhoids.                           - No specimens collected. Recommendation:           - Patient has a contact number available for                            emergencies. The signs and symptoms of potential                            delayed complications were discussed with the                            patient. Return to normal activities  tomorrow.                            Written discharge instructions were provided to the                            patient.                           - Resume previous diet.                           - Continue present medications.                           - No repeat colonoscopy due to redundat tortous                            colon.                           - Consider Virtual CT colonoscopy or Cologard for                            colorectal cancer screening                           - Start Benefiber 1 tablespoon TID with meals and                            Miralax 1 capful TID , titrate to have  daily bowel                            movements                           - Follow up in GI office next available appointment                           - Follow up with GYN and plevic ultrasound                           - OK to discharge home from GI standpoint Procedure Code(s):        --- Professional ---                           [phone], 53, Colonoscopy, flexible; diagnostic,                            including collection of specimen(s) by brushing or                            washing, when performed (separate procedure) Diagnosis Code(s):        --- Professional ---                           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 All rights reserved. The codes documented in this report are preliminary and upon coder review may  be revised to meet current compliance requirements. Napoleon Form, MD 09/29/2020 12:27:20 PM This report has been signed electronically. Number of Addenda: 0

## 2020-09-29 NOTE — Transfer of Care (Signed)
Immediate Anesthesia Transfer of Care Note  Patient: Dawn Potter  Procedure(s) Performed: COLONOSCOPY WITH PROPOFOL (N/A )  Patient Location: PACU endoscopy  Anesthesia Type:MAC  Level of Consciousness: awake and drowsy  Airway & Oxygen Therapy: Patient Spontanous Breathing and Patient connected to nasal cannula oxygen  Post-op Assessment: Report given to RN and Post -op Vital signs reviewed and stable  Post vital signs: Reviewed and stable  Last Vitals:  Vitals Value Taken Time  BP 114/56 09/29/20 1222  Temp 36.4 C 09/29/20 1222  Pulse 78 09/29/20 1227  Resp 15 09/29/20 1227  SpO2 97 % 09/29/20 1227  Vitals shown include unvalidated device data.  Last Pain:  Vitals:   09/29/20 1222  TempSrc: Oral  PainSc: 0-No pain         Complications: No complications documented.

## 2020-09-29 NOTE — Plan of Care (Signed)
  Problem: Education: Goal: Knowledge of General Education information will improve Description: Including pain rating scale, medication(s)/side effects and non-pharmacologic comfort measures 09/29/2020 1829 by Christene Lye, RN Outcome: Adequate for Discharge 09/29/2020 0753 by Christene Lye, RN Outcome: Progressing   Problem: Health Behavior/Discharge Planning: Goal: Ability to manage health-related needs will improve 09/29/2020 1829 by Christene Lye, RN Outcome: Adequate for Discharge 09/29/2020 0753 by Christene Lye, RN Outcome: Progressing   Problem: Clinical Measurements: Goal: Ability to maintain clinical measurements within normal limits will improve 09/29/2020 1829 by Christene Lye, RN Outcome: Adequate for Discharge 09/29/2020 0753 by Christene Lye, RN Outcome: Progressing Goal: Will remain free from infection Outcome: Adequate for Discharge Goal: Diagnostic test results will improve Outcome: Adequate for Discharge Goal: Respiratory complications will improve Outcome: Adequate for Discharge Goal: Cardiovascular complication will be avoided Outcome: Adequate for Discharge   Problem: Activity: Goal: Risk for activity intolerance will decrease Outcome: Adequate for Discharge   Problem: Nutrition: Goal: Adequate nutrition will be maintained Outcome: Adequate for Discharge   Problem: Coping: Goal: Level of anxiety will decrease Outcome: Adequate for Discharge   Problem: Elimination: Goal: Will not experience complications related to bowel motility Outcome: Adequate for Discharge Goal: Will not experience complications related to urinary retention Outcome: Adequate for Discharge   Problem: Pain Managment: Goal: General experience of comfort will improve Outcome: Adequate for Discharge   Problem: Safety: Goal: Ability to remain free from injury will improve Outcome: Adequate for Discharge   Problem: Skin Integrity: Goal: Risk for impaired skin  integrity will decrease Outcome: Adequate for Discharge

## 2020-09-29 NOTE — Anesthesia Preprocedure Evaluation (Signed)
Anesthesia Evaluation  Patient identified by MRN, date of birth, ID band Patient awake    Reviewed: Allergy & Precautions, NPO status , Patient's Chart, lab work & pertinent test results  Airway Mallampati: II   Neck ROM: full    Dental   Pulmonary neg pulmonary ROS,    breath sounds clear to auscultation       Cardiovascular negative cardio ROS   Rhythm:regular Rate:Normal     Neuro/Psych    GI/Hepatic Contipation, abdominal pain.  H/o volvulus sigmoid colon s/p ex-lap   Endo/Other    Renal/GU      Musculoskeletal   Abdominal   Peds  Hematology   Anesthesia Other Findings   Reproductive/Obstetrics                             Anesthesia Physical Anesthesia Plan  ASA: II  Anesthesia Plan: MAC   Post-op Pain Management:    Induction: Intravenous  PONV Risk Score and Plan: 2 and Propofol infusion and Treatment may vary due to age or medical condition  Airway Management Planned: Nasal Cannula  Additional Equipment:   Intra-op Plan:   Post-operative Plan:   Informed Consent: I have reviewed the patients History and Physical, chart, labs and discussed the procedure including the risks, benefits and alternatives for the proposed anesthesia with the patient or authorized representative who has indicated his/her understanding and acceptance.     Dental advisory given  Plan Discussed with: CRNA, Anesthesiologist and Surgeon  Anesthesia Plan Comments:         Anesthesia Quick Evaluation

## 2020-09-29 NOTE — Progress Notes (Signed)
Patient c/o chest pain and sore throat post procedure O2sat at RA 99% Vitals stable EKG no significant changes CXR showed LLL pneumonia, possible aspiration.   Patient had excessive secretions during procedure, was aggressively suctioned   Will empirically treat with Augmentin 875-125mg  BID X 7 days for aspiration pneumonia Incentive spirometry Levsin SL Q6H prn for abdominal cramping Magic mouthwash with lidocaine swish and swallow 10cc q4-6hr PRN  Iona Beard , MD [phone]

## 2020-09-29 NOTE — Anesthesia Postprocedure Evaluation (Signed)
Anesthesia Post Note  Patient: Dawn Potter  Procedure(s) Performed: COLONOSCOPY WITH PROPOFOL (N/A )     Patient location during evaluation: Endoscopy Anesthesia Type: MAC Level of consciousness: awake and alert Pain management: pain level controlled Vital Signs Assessment: post-procedure vital signs reviewed and stable Respiratory status: spontaneous breathing, nonlabored ventilation, respiratory function stable and patient connected to nasal cannula oxygen Cardiovascular status: stable and blood pressure returned to baseline Postop Assessment: no apparent nausea or vomiting Anesthetic complications: no   No complications documented.  Last Vitals:  Vitals:   09/29/20 1302 09/29/20 1312  BP: (!) 147/88 (!) 142/78  Pulse: 74 75  Resp: 11 14  Temp:    SpO2: 98% 97%    Last Pain:  Vitals:   09/29/20 1312  TempSrc:   PainSc: 3                  Salik Grewell S

## 2020-09-29 NOTE — Plan of Care (Signed)

## 2020-09-29 NOTE — Discharge Summary (Addendum)
Physician Discharge Summary  Dawn Potter:[id number] DOB: 05-Jan-1966 DOA: 09/26/2020  PCP: Olivia Mackie, MD  Admit date: 09/26/2020 Discharge date: 09/29/2020  Admitted From: Home Discharge disposition: Home   Code Status: Full Code  Diet Recommendation: Regular diet  Discharge Diagnosis:   Active Problems:   Ileus (HCC)   Chronic constipation   Volvulus of sigmoid colon [facility])  Chief Complaint  Patient presents with  . Abdominal Pain   Brief narrative: Dawn Potter is a 55 y.o. female with PMH significant for exploratory laparotomy 31 years ago with subsequent chronic constipation, bowel movement every 1 to 4 weeks. Patient presented to the ED on 4/19 with complaint of severe abdominal pain and constipation not relieving with MiraLAX, senna, docusate, and mag citrate. In the ED, she was hemodynamically stable Labs unremarkable   CT abdomen/pelvis with contrast showed changes concerning for colonic ileus with possible sigmoid volvulus.  Complex cystic lesions in the left adnexal space/ovary was also noted. Admitted to hospitalist service at [facility] GI and general surgery consultation were obtained. Patient underwent colonoscopy today 4/22.  Report as below  Subjective: Patient was seen and examined this morning. Not in distress.  Hospital course: Acute abdominal pain in the setting of chronic severe constipation Possible sigmoid volvulus -GI and surgery consultation were obtained.   -Underwent barium enema on 4/20.  It did not show obstruction or volvulus.  No need of surgical intervention.   -4/22, patient underwent colonoscopy by GI.  Noted to have dilated entire colon with nonbleeding internal and external hemorrhoids.  -Per GI recommendation, patient has been started on Benefiber 1 tablespoon TID with meals and Miralax 1 cupful TID to titrate to have daily bowel movements. -Levsin PRN and magic mouthwash PRN per GI  Left adnexal complex cystic  lesions: -CT abdomen on admission showed a complex cystic lesion in the left adnexa of 3.6 cm.   -Remote CT pelvis in 2005 showed a 7 cm diameter cyst in the left adnexa.   -Recommend pelvic ultrasound as an outpatient.  Left lower lobe Aspiration pneumonia -Per GI, during colonoscopy today, patient has significant amount of secretions. She aspirated leading to mild respiratory distress.  Respiratory status stabilized pretty soon. CXR showed LLL pneumonia. Not requiring oxygen anymore. -Plan to discharge on Augmentin for next 7 days.  Wound care:    Discharge Exam:   Vitals:   09/29/20 1252 09/29/20 1302 09/29/20 1312 09/29/20 1420  BP: 138/81 (!) 147/88 (!) 142/78 130/78  Pulse: 75 74 75 77  Resp: 15 11 14 18   Temp:    97.9 F (36.6 C)  TempSrc:    Oral  SpO2: 100% 98% 97% 100%  Weight:      Height:        Body mass index is 25.91 kg/m.  General exam: Pleasant, middle-aged Caucasian female.  Not in distress Skin: No rashes, lesions or ulcers. HEENT: Atraumatic, normocephalic, no obvious bleeding Lungs: Clear to auscultation bilaterally CVS: Regular rate and rhythm, no murmur GI/Abd soft, nontender, nondistended, bowel sound present CNS: Alert, awake, oriented x3 Psychiatry: Mood appropriate Extremities: No pedal edema, no calf tenderness  Follow ups:   Discharge Instructions    Diet general   Complete by: As directed    Increase activity slowly   Complete by: As directed       Follow-up Information    , MD Follow up.   Specialty: Obstetrics and Gynecology Contact information: [address] [place] [phone]  Napoleon Form, MD Follow up.   Specialty: Gastroenterology Contact information: [address] [place] [address] [phone]               Recommendations for Outpatient Follow-Up:   1. Follow-up with PCP as an outpatient 2. Follow-up with GI as an outpatient  Discharge  Instructions:  Follow with Primary MD Olivia Mackie, MD in 7 days   Get CBC/BMP checked in next visit within 1 week by PCP or SNF MD ( we routinely change or add medications that can affect your baseline labs and fluid status, therefore we recommend that you get the mentioned basic workup next visit with your PCP, your PCP may decide not to get them or add new tests based on their clinical decision)  On your next visit with your PCP, please Get Medicines reviewed and adjusted.  Please request your PCP  to go over all Hospital Tests and Procedure/Radiological results at the follow up, please get all Hospital records sent to your Prim MD by signing hospital release before you go home.  Activity: As tolerated with Full fall precautions use walker/cane & assistance as needed  For Heart failure patients - Check your Weight same time everyday, if you gain over 2 pounds, or you develop in leg swelling, experience more shortness of breath or chest pain, call your Primary MD immediately. Follow Cardiac Low Salt Diet and 1.5 lit/day fluid restriction.  If you have smoked or chewed Tobacco in the last 2 yrs please stop smoking, stop any regular Alcohol  and or any Recreational drug use.  If you experience worsening of your admission symptoms, develop shortness of breath, life threatening emergency, suicidal or homicidal thoughts you must seek medical attention immediately by calling 911 or calling your MD immediately  if symptoms less severe.  You Must read complete instructions/literature along with all the possible adverse reactions/side effects for all the Medicines you take and that have been prescribed to you. Take any new Medicines after you have completely understood and accpet all the possible adverse reactions/side effects.   Do not drive, operate heavy machinery, perform activities at heights, swimming or participation in water activities or provide baby sitting services if your were admitted for  syncope or siezures until you have seen by Primary MD or a Neurologist and advised to do so again.  Do not drive when taking Pain medications.  Do not take more than prescribed Pain, Sleep and Anxiety Medications  Wear Seat belts while driving.   Please note You were cared for by a hospitalist during your hospital stay. If you have any questions about your discharge medications or the care you received while you were in the hospital after you are discharged, you can call the unit and asked to speak with the hospitalist on call if the hospitalist that took care of you is not available. Once you are discharged, your primary care physician will handle any further medical issues. Please note that NO REFILLS for any discharge medications will be authorized once you are discharged, as it is imperative that you return to your primary care physician (or establish a relationship with a primary care physician if you do not have one) for your aftercare needs so that they can reassess your need for medications and monitor your lab values.    Allergies as of 09/29/2020      Reactions   Ketoconazole Hives      Medication List    STOP taking these medications  meloxicam 15 MG tablet Commonly known as: MOBIC     TAKE these medications   amoxicillin-clavulanate 875-125 MG tablet Commonly known as: Augmentin Take 1 tablet by mouth 2 (two) times daily for 7 days.   Benefiber Drink Mix Pack Take 1 Scoop by mouth in the morning, at noon, and at bedtime. 1 tablespoon TID with meals   Claritin Reditabs 10 MG dissolvable tablet Generic drug: loratadine Take 10 mg by mouth daily.   diphenhydrAMINE 25 MG tablet Commonly known as: BENADRYL Take 25 mg by mouth every 6 (six) hours as needed for allergies.   Flonase Sensimist 27.5 MCG/SPRAY nasal spray Generic drug: fluticasone Place 1-2 sprays into the nose daily.   phentermine 37.5 MG capsule Take 37.5 mg by mouth every morning.   polyethylene  glycol 17 g packet Commonly known as: MiraLax Take 17 g by mouth 3 (three) times daily.   zolpidem 10 MG tablet Commonly known as: AMBIEN Take 10 mg by mouth at bedtime as needed for sleep.       Time coordinating discharge: 35 minutes  The results of significant diagnostics from this hospitalization (including imaging, microbiology, ancillary and laboratory) are listed below for reference.    Procedures and Diagnostic Studies:   CT ABDOMEN PELVIS W CONTRAST  Result Date: 09/26/2020 CLINICAL DATA:  Abdominal pain.  Concern for bowel obstruction. EXAM: CT ABDOMEN AND PELVIS WITH CONTRAST TECHNIQUE: Multidetector CT imaging of the abdomen and pelvis was performed using the standard protocol following bolus administration of intravenous contrast. CONTRAST:  80mL OMNIPAQUE IOHEXOL 300 MG/ML  SOLN COMPARISON:  12/08/2003 FINDINGS: Lower chest: 3 mm right lower lobe nodule on image 1 of series 4 is stable in the long interval since prior study consistent with benign etiology. Hepatobiliary: No suspicious focal abnormality within the liver parenchyma. There is no evidence for gallstones, gallbladder wall thickening, or pericholecystic fluid. No intrahepatic or extrahepatic biliary dilation. Pancreas: No focal mass lesion. No dilatation of the main duct. No intraparenchymal cyst. No peripancreatic edema. Spleen: No splenomegaly. No focal mass lesion. Adrenals/Urinary Tract: No adrenal nodule or mass. Kidneys unremarkable. No evidence for hydroureter. The urinary bladder appears normal for the degree of distention. Stomach/Bowel: Tiny hiatal hernia. Stomach is decompressed. Duodenum is normally positioned as is the ligament of Treitz. Small bowel is nondilated. Terminal ileum is well seen on coronal image 52/5 and is positioned just anterior to the right psoas muscle on axial 55/2 with the ileocecal valve visible on image 56/2. The ascending and transverse colon is dilated and stool-filled in the colon  remains distended and stool-filled down to the level of the sigmoid segment. This sigmoid segment is markedly elongated and extends up into the anterior right abdomen, anterior to the liver. The sigmoid segment is air-filled and dilated with no substantial stool volume. There is mesenteric twisting with 1 area of narrowing identified in the sigmoid colon in the right central abdomen (axial image 44/2) representing the distal sigmoid colon although a more proximal area of sigmoid colon narrowing is not evident. Distal sigmoid colon and rectum are fluid-filled. Vascular/Lymphatic: No abdominal aortic aneurysm. There is no gastrohepatic or hepatoduodenal ligament lymphadenopathy. No retroperitoneal or mesenteric lymphadenopathy. No pelvic sidewall lymphadenopathy. Reproductive: Adjacent cystic structures identified in the left adnexal space measuring 3.6 cm in 3.0 cm. Both lesions have attenuation higher than would be expected for simple fluid. No right adnexal mass. IUD is visualized in the uterus. Other: No intraperitoneal free fluid. Musculoskeletal: No worrisome lytic or sclerotic osseous abnormality.  IMPRESSION: 1. No evidence for small bowel obstruction. The colon is diffusely dilated with cecum measuring up to 7-8 cm diameter. Sigmoid colon measures up to 5 cm diameter. The sigmoid colon is markedly elongated and redundant, extending up into the right abdomen, anterior to the liver. There is an area of narrowing in the distal sigmoid colon as it enters the low pelvis with some associated mesenteric twisting, but a second area of sigmoid colon narrowing cannot be identified. There is no colonic wall thickening or associated pericolonic edema/intraperitoneal fluid. Imaging features may be related to marked colonic ileus/colonic dysmotility with the single area of narrowing bean related to sigmoid colon becoming non dependent towards the rectum in the low pelvis. Sigmoid volvulus is also a consideration, but not a  definite CT finding. 2. Complex cystic lesions in the left adnexal space/ovary measuring up to 3.6 cm. These may be hemorrhagic cysts. Pelvic ultrasound follow-up recommended to further evaluate. Electronically Signed   By: Kennith Center M.D.   On: 09/26/2020 14:32   DG BE (COLON)W SINGLE CM (SOL OR THIN BA)  Result Date: 09/27/2020 CLINICAL DATA:  Constipation.  Abnormal CT.  Dilated sigmoid colon EXAM: WATER SOLUBLE CONTRAST ENEMA TECHNIQUE: Water-soluble enema performed with 600 mL Omnipaque 300 mixed with 2000 mL water. FLUOROSCOPY TIME:  Fluoroscopy Time:  5 minutes 42 second Radiation Exposure Index (if provided by the fluoroscopic device): Number of Acquired Spot Images: 14 COMPARISON:  CT abdomen pelvis 09/26/2020 FINDINGS: Preliminary KUB demonstrates moderate amount of stool particularly in the sigmoid colon. Mild gaseous distention of the colon diffusely including the right colon. IUD noted. Normal rectum. Sigmoid colon is markedly redundant and dilated. Evaluation of the sigmoid colon was difficult with multiple positions including decubitus and prone positioning. There is a large amount of retained stool in the left colon. Eventually contrast progressed into the splenic flexure however the transverse colon and right colon were not opacified during the study despite multiple positional maneuvers. No mass lesion identified. No mucosal edema or diverticular change. No evidence of sigmoid volvulus. IMPRESSION: Markedly redundant and dilated sigmoid colon. Negative for sigmoid volvulus. Large amount of retained stool left colon. No obstructing mass lesion Transverse and right colon not evaluated due to markedly redundant colon and large volume colon. Question chronic colonic dysmotility. Electronically Signed   By: Marlan Palau M.D.   On: 09/27/2020 09:30     Labs:   Basic Metabolic Panel: Recent Labs  Lab 09/26/20 1215 09/27/20 0220  NA 136 137  K 5.1 3.9  CL 102 106  CO2 25 22  GLUCOSE  95 85  BUN 7 <5*  CREATININE 0.75 0.83  CALCIUM 9.6 9.1   GFR Estimated Creatinine Clearance: 85.8 mL/min (by C-G formula based on SCr of 0.83 mg/dL). Liver Function Tests: Recent Labs  Lab 09/26/20 1215  AST 36  ALT 21  ALKPHOS 45  BILITOT 0.8  PROT 6.9  ALBUMIN 4.5   Recent Labs  Lab 09/26/20 1215  LIPASE 36   No results for input(s): AMMONIA in the last 168 hours. Coagulation profile No results for input(s): INR, PROTIME in the last 168 hours.  CBC: Recent Labs  Lab 09/26/20 1215 09/27/20 0220  WBC 8.3 8.3  HGB 13.7 13.6  HCT 40.3 40.7  MCV 84.5 86.2  PLT 169 150   Cardiac Enzymes: No results for input(s): CKTOTAL, CKMB, CKMBINDEX, TROPONINI in the last 168 hours. BNP: Invalid input(s): POCBNP CBG: No results for input(s): GLUCAP in the last 168 hours.  D-Dimer No results for input(s): DDIMER in the last 72 hours. Hgb A1c No results for input(s): HGBA1C in the last 72 hours. Lipid Profile No results for input(s): CHOL, HDL, LDLCALC, TRIG, CHOLHDL, LDLDIRECT in the last 72 hours. Thyroid function studies No results for input(s): TSH, T4TOTAL, T3FREE, THYROIDAB in the last 72 hours.  Invalid input(s): FREET3 Anemia work up No results for input(s): VITAMINB12, FOLATE, FERRITIN, TIBC, IRON, RETICCTPCT in the last 72 hours. Microbiology Recent Results (from the past 240 hour(s))  Resp Panel by RT-PCR (Flu A&B, Covid) Nasopharyngeal Swab     Status: None   Collection Time: 09/26/20  3:29 PM   Specimen: Nasopharyngeal Swab; Nasopharyngeal(NP) swabs in vial transport medium  Result Value Ref Range Status   SARS Coronavirus 2 by RT PCR NEGATIVE NEGATIVE Final    Comment: (NOTE) SARS-CoV-2 target nucleic acids are NOT DETECTED.  The SARS-CoV-2 RNA is generally detectable in upper respiratory specimens during the acute phase of infection. The lowest concentration of SARS-CoV-2 viral copies this assay can detect is 138 copies/mL. A negative result does not  preclude SARS-Cov-2 infection and should not be used as the sole basis for treatment or other patient management decisions. A negative result may occur with  improper specimen collection/handling, submission of specimen other than nasopharyngeal swab, presence of viral mutation(s) within the areas targeted by this assay, and inadequate number of viral copies(<138 copies/mL). A negative result must be combined with clinical observations, patient history, and epidemiological information. The expected result is Negative.  Fact Sheet for Patients:  BloggerCourse.comhttps://www.fda.gov/media/152166/download  Fact Sheet for Healthcare Providers:  SeriousBroker.ithttps://www.fda.gov/media/152162/download  This test is no t yet approved or cleared by the [place] FDA and  has been authorized for detection and/or diagnosis of SARS-CoV-2 by FDA under an Emergency Use Authorization (EUA). This EUA will remain  in effect (meaning this test can be used) for the duration of the COVID-19 declaration under Section 564(b)(1) of the Act, 21 U.S.C.section 360bbb-3(b)(1), unless the authorization is terminated  or revoked sooner.       Influenza A by PCR NEGATIVE NEGATIVE Final   Influenza B by PCR NEGATIVE NEGATIVE Final    Comment: (NOTE) The Xpert Xpress SARS-CoV-2/FLU/RSV plus assay is intended as an aid in the diagnosis of influenza from Nasopharyngeal swab specimens and should not be used as a sole basis for treatment. Nasal washings and aspirates are unacceptable for Xpert Xpress SARS-CoV-2/FLU/RSV testing.  Fact Sheet for Patients: BloggerCourse.comhttps://www.fda.gov/media/152166/download  Fact Sheet for Healthcare Providers: SeriousBroker.ithttps://www.fda.gov/media/152162/download  This test is not yet approved or cleared by the [place] FDA and has been authorized for detection and/or diagnosis of SARS-CoV-2 by FDA under an Emergency Use Authorization (EUA). This EUA will remain in effect (meaning this test can be used) for the  duration of the COVID-19 declaration under Section 564(b)(1) of the Act, 21 U.S.C. section 360bbb-3(b)(1), unless the authorization is terminated or revoked.  Performed at [facility]      Signed: Lorin GlassBinaya Mustapha Colson  [facility] 09/29/2020, 2:25 PM

## 2020-09-29 NOTE — Interval H&P Note (Signed)
History and Physical Interval Note:  09/29/2020 10:54 AM  Dawn Potter  has presented today for surgery, with the diagnosis of constipation, abdominal pain.  The various methods of treatment have been discussed with the patient and family. After consideration of risks, benefits and other options for treatment, the patient has consented to  Procedure(s): COLONOSCOPY WITH PROPOFOL (N/A) as a surgical intervention.  The patient's history has been reviewed, patient examined, no change in status, stable for surgery.  I have reviewed the patient's chart and labs.  Questions were answered to the patient's satisfaction.     Patrick Sohm

## 2020-10-05 ENCOUNTER — Telehealth: Payer: Self-pay

## 2020-10-05 ENCOUNTER — Other Ambulatory Visit: Payer: Self-pay

## 2020-10-05 MED ORDER — NYSTATIN 100000 UNIT/ML MT SUSP: 5.0000 mL | Freq: Four times a day (QID) | OROMUCOSAL | 0 refills | Status: DC

## 2020-10-05 NOTE — Telephone Encounter (Signed)
Spoke with the patient. She is calling due to her hoarseness and throat discomfort.  She had a procedure 09/29/20 at [facility]. She states she was hoarse after the procedure. Calls because it is worsening. She has discomfort with swallowing. No dysphagia or choking. No urge to cough. Cold fluids feel best to her throat. Using Magic Mouthwash with temporary relief. Afebrile. Patient was difficult to understand at times due to her poor vocalization quality. Recommendations?

## 2020-10-05 NOTE — Telephone Encounter (Signed)
Allergy to Ketoconazole. Prescription changed to Nystatin as per Dr Lavon Paganini. The patient is aware of the plan. She agrees to this and will follow up with her PCP if she fails to improve.

## 2020-10-05 NOTE — Telephone Encounter (Signed)
Sorry to hear that she is having persistent hoarseness and throat discomfort. She aspirated during colonoscopy, oropharynx was vigorously suctioned by anesthesia. Please advise patient to continue Magic mouthwash with lidocaine up to 4 times daily as needed, please send refill. Complete the antibiotic course for aspiration pneumonia Possible oropharyngeal Candida, causing the discomfort with swallowing.  Will empirically treat with fluconazole 100 mg daily X 3 days.  Please send prescription Please advise patient to follow-up with PMD if she continues to have hoarseness and discomfort to exclude other etiologies or bronchitis.

## 2020-11-02 ENCOUNTER — Encounter: Payer: Self-pay | Admitting: Internal Medicine

## 2020-11-02 ENCOUNTER — Ambulatory Visit (INDEPENDENT_AMBULATORY_CARE_PROVIDER_SITE_OTHER): Payer: BLUE CROSS/BLUE SHIELD | Admitting: Internal Medicine

## 2020-11-02 VITALS — BP 104/62 | HR 63 | Ht 68.0 in | Wt 170.0 lb

## 2020-11-02 DIAGNOSIS — K5909 Other constipation: Secondary | ICD-10-CM | POA: Diagnosis not present

## 2020-11-02 DIAGNOSIS — K562 Volvulus: Secondary | ICD-10-CM

## 2020-11-02 DIAGNOSIS — N3941 Urge incontinence: Secondary | ICD-10-CM | POA: Diagnosis not present

## 2020-11-02 NOTE — Progress Notes (Signed)
Dawn Potter 55 y.o. Jun 17, 1965 [id number]  Assessment & Plan:   Encounter Diagnoses  Name Primary?  . Chronic constipation Yes  . Volvulus of sigmoid colon (HCC)?   Marland Kitchen Urge urinary incontinence    Chronic constipation situation and I think she had a volvulus that was improving when she mated to the hospital in April.  Clinically that is what it sounds like.  We talked about the need to evaluate this further.  She will continue her current laxative regimen as she is comfortable with that right now though we may consider other medications.  I explained that resection of a portion of the colon might be in her best interest to prevent further problems.  She is understandably reluctant to do that because of time off work.  However she wishes to avoid events like she had previously and that may be required to accomplish that.  Plan for an anorectal manometry.  I do not think it makes sense to do a Sitzmarks study based upon her overall history and I am not sure that would change anything at this time.  Consider the possibility of MR defecography depending upon what is seen.  Not mentioned at the time of her visit is that she is on phentermine and I have found that that exacerbates constipation in people.  Message to the patient about that.  I appreciate the opportunity to care for this patient. 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  It was not confirmed.  She was having excruciating pain that was building over time and lack of bowel movements.  She kept working through things.  She was in extreme distress when she went to the hospital.   Imaging results: (Direct  vision of images as well)  IMPRESSION: 1. No evidence for small bowel obstruction. The colon is diffusely dilated with cecum measuring up to 7-8 cm diameter. Sigmoid colon measures up to 5 cm diameter. The sigmoid colon is markedly elongated and redundant, extending up into the right abdomen, anterior to the liver. There is an area of narrowing in the distal sigmoid colon as it enters the low pelvis with some associated mesenteric twisting, but a second area of sigmoid colon narrowing cannot be identified. There is no colonic wall thickening or associated pericolonic edema/intraperitoneal fluid. Imaging features may be related to marked colonic ileus/colonic dysmotility with the single area of narrowing bean related to sigmoid colon becoming non dependent towards the rectum in the low pelvis. Sigmoid volvulus is also a consideration, but not a definite CT finding. 2. Complex cystic lesions in the left adnexal space/ovary measuring up to 3.6 cm. These may be hemorrhagic cysts. Pelvic ultrasound follow-up recommended to further evaluate.   Electronically Signed   By: Kennith Center M.D.   On: 09/26/2020 14:32  Ovarian lesions evaluated by Dr. Billy Coast and negative IMPRESSION: Markedly redundant and dilated sigmoid colon. Negative for sigmoid volvulus.  Large amount of retained stool left colon.  No obstructing mass lesion  Transverse and right colon not evaluated due to markedly redundant colon and large volume colon. Question chronic colonic dysmotility.   Electronically Signed   By: Marlan Palau M.D.   On: 09/27/2020 09:30  Colonoscopy was accomplished to the hepatic flexure by Dr. Nathaniel Man and was negative to that point.  Report findings as below   Dilated in the entire examined colon. - Non-bleeding external and internal hemorrhoids. - No specimens collected.  -She had some chest symptoms afterwards and had an infiltrate and was treated for potential  aspiration pneumonia.   We reviewed her overall history and in the early 20s she had an exploratory laparotomy for any fever she says and she was found to have a kidney infection.  Ever since then she has had chronic constipation.  She is tried Linzess in the past when she had seen Dr. Loreta Ave and was also placed on probiotics at one point.  Nothing is helped too much overall.  She is currently using a mixture of Benefiber and stool softener   She goes for days without a good bowel movement.  She gets an urge to defecate but then has difficulty producing a stool.  She is worried something nefarious could be going on as her colonoscopy was not complete.  Also because of the severity of her symptoms.   She does have urge urinary incontinence. Allergies  Allergen Reactions  . Ketoconazole Hives   Current Meds  Medication Sig  . diphenhydrAMINE (BENADRYL) 25 MG tablet Take 25 mg by mouth every 6 (six) hours as needed for sleep.  . fluticasone (FLONASE SENSIMIST) 27.5 MCG/SPRAY nasal spray Place 1-2 sprays into the nose daily.  Marland Kitchen Hyoscyamine Sulfate SL (LEVSIN/SL) 0.125 MG SUBL Place 1 each under the tongue every 6 (six) hours as needed (for abd cramping).  Marland Kitchen loratadine (CLARITIN REDITABS) 10 MG dissolvable tablet Take 10 mg by mouth daily.  . phentermine 37.5 MG capsule Take 37.5 mg by mouth every morning.  . polyethylene glycol (MIRALAX / GLYCOLAX) 17 g packet Take 17 g by mouth in the morning, at noon, and at bedtime.  . Wheat Dextrin (BENEFIBER PO) Take by mouth. 1 scoop three times daily  . zolpidem (AMBIEN) 10 MG tablet Take 10 mg by mouth at bedtime as needed for sleep.  . [DISCONTINUED] Wheat Dextrin (BENEFIBER DRINK MIX) PACK Take 1 Scoop by mouth in the morning, at noon, and at bedtime. 1 tablespoon TID with meals   History reviewed. No pertinent past medical history. Past Surgical History:  Procedure Laterality Date  . COLONOSCOPY WITH PROPOFOL N/A 09/29/2020   Procedure: COLONOSCOPY  WITH PROPOFOL;  Surgeon: Napoleon Form, MD;  Location: [facility];  Service: Endoscopy;  Laterality: N/A;  . exploratory abdominla surgery N/A    Social History   Social History Narrative  . Not on file   family history is not on file.   Review of Systems As per HPI  Objective:   Physical Exam BP 104/62   Pulse 63   Ht 5\' 8"  (1.727 m)   Wt 170 lb (77.1 kg)   BMI 25.85 kg/m    Patti , CMA present Abdomen soft benign  Rectal exam nontender no mass anal wink is intact normal anoderm  Normal resting tone and voluntary squeeze  Appropriate abdominal contraction and descent and relaxation   Data reviewed as reflected above also includes hospitalization GI consultation procedure notes Hospital discharge summary surgical consultation labs

## 2020-11-02 NOTE — Patient Instructions (Addendum)
You have been scheduled to have an anorectal manometry at [facility] on 12/01/2020 at 8:30am. Please arrive 30 minutes prior to your appointment time for registration (1st floor of the hospital-admissions).  Please make certain to use 1 Fleets enema 2 hours prior to coming for your appointment. You can purchase Fleets enemas from the laxative section at your drug store. You should not eat anything during the two hours prior to the procedure. You may take regular medications with small sips of water at least 2 hours prior to the study.  Anorectal manometry is a test performed to evaluate patients with constipation or fecal incontinence. This test measures the pressures of the anal sphincter muscles, the sensation in the rectum, and the neural reflexes that are needed for normal bowel movements.  THE PROCEDURE The test takes approximately 30 minutes to 1 hour. You will be asked to change into a hospital gown. A technician or nurse will explain the procedure to you, take a brief health history, and answer any questions you may have. The patient then lies on his or her left side. A small, flexible tube, about the size of a thermometer, with a balloon at the end is inserted into the rectum. The catheter is connected to a machine that measures the pressure. During the test, the small balloon attached to the catheter may be inflated in the rectum to assess the normal reflex pathways. The nurse or technician may also ask the person to squeeze, relax, and push at various times. The anal sphincter muscle pressures are measured during each of these maneuvers. To squeeze, the patient tightens the sphincter muscles as if trying to prevent anything from coming out. To push or bear down, the patient strains down as if trying to have a bowel movement.  I appreciate the opportunity to care for you. Stan Head, MD, [facility]

## 2020-11-03 ENCOUNTER — Encounter: Payer: Self-pay | Admitting: Internal Medicine

## 2020-12-01 ENCOUNTER — Ambulatory Visit (HOSPITAL_COMMUNITY)
Admission: RE | Admit: 2020-12-01 | Discharge: 2020-12-01 | Disposition: A | Discharge status: Home / Self Care | Payer: BLUE CROSS/BLUE SHIELD | Attending: Internal Medicine | Admitting: Internal Medicine

## 2020-12-01 ENCOUNTER — Encounter (HOSPITAL_COMMUNITY)
Admission: RE | Disposition: A | Discharge status: Home / Self Care | Payer: Self-pay | Source: Home / Self Care | Attending: Internal Medicine

## 2020-12-01 DIAGNOSIS — Z538 Procedure and treatment not carried out for other reasons: Secondary | ICD-10-CM | POA: Insufficient documentation

## 2020-12-01 DIAGNOSIS — K5909 Other constipation: Secondary | ICD-10-CM | POA: Insufficient documentation

## 2020-12-01 SURGERY — INVASIVE LAB ABORTED CASE

## 2020-12-01 MED ORDER — FLEET ENEMA 7-19 GM/118ML RE ENEM
ENEMA | RECTAL | Status: AC
  Filled 2020-12-01: qty 1

## 2020-12-01 NOTE — Progress Notes (Signed)
Patient here for anorectal manometry.  2 RNs present for procedure.   Attempted probe insertion but met resistance.  Upon removal of the probe there was solid brown stool.  Had patient do a second fleets enema.  Attempted probe insertion again but was met with resistance.  It was decided to abort the procedure.  Dr Silvano Rusk notified.

## 2022-05-21 ENCOUNTER — Encounter: Payer: Self-pay | Admitting: Internal Medicine

## 2022-05-21 ENCOUNTER — Ambulatory Visit (INDEPENDENT_AMBULATORY_CARE_PROVIDER_SITE_OTHER): Payer: BLUE CROSS/BLUE SHIELD | Admitting: Internal Medicine

## 2022-05-21 ENCOUNTER — Ambulatory Visit (INDEPENDENT_AMBULATORY_CARE_PROVIDER_SITE_OTHER)
Admission: RE | Admit: 2022-05-21 | Discharge: 2022-05-21 | Disposition: A | Discharge status: Home / Self Care | Payer: BLUE CROSS/BLUE SHIELD | Source: Ambulatory Visit | Attending: Internal Medicine | Admitting: Internal Medicine

## 2022-05-21 VITALS — BP 110/82 | HR 74 | Ht 68.0 in | Wt 192.0 lb

## 2022-05-21 DIAGNOSIS — R933 Abnormal findings on diagnostic imaging of other parts of digestive tract: Secondary | ICD-10-CM

## 2022-05-21 DIAGNOSIS — K5909 Other constipation: Secondary | ICD-10-CM

## 2022-05-21 NOTE — Patient Instructions (Signed)
Please go to the basement and get an x-ray before leaving.  You have been scheduled for an appointment with ________________ at [facility]. Your appointment is on _________________ at _______________. Please arrive at ___________________ for registration. Make certain to bring a list of current medications, including any over the counter medications or vitamins. Also bring your co-pay if you have one as well as your insurance cards. [facility] is located at [address], [address]. Should you need to reschedule your appointment, please contact them at [phone].   I appreciate the opportunity to care for you. Stan Head, MD

## 2022-05-21 NOTE — Progress Notes (Signed)
Dawn Potter 56 y.o. 20-Mar-1966 [id number]  Assessment & Plan:   Encounter Diagnoses  Name Primary?   Chronic constipation Yes   Abnormal CT scan, sigmoid colon question sigmoid volvulus    The balance of evidence we have suggests a colonic motility issue and redundant sigmoid colon and possible volvulus.  I am referring Dawn Potter to colorectal surgery for further evaluation.  She asks if adhesions from prior laparotomy could be playing a role.  I suppose it is possible but the clinical scenario is not consistent with that in my experience.  Question if she could have had some sort of vagal nerve branch injury that led to this?  She is also concerned about impact of possible surgery on Dawn Potter job function.  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  2 view abdominal film today to check status of colon dilation.  I suppose we may need to try to pursue an anorectal manometry again but would defer to the surgeons.  I am not sure why the probe could not be inserted, question if she had some sort of an impaction though she did take enemas, nothing we know to date based upon imaging, this includes CT and barium enema and colonoscopy shows an anatomic deformity that would explain this.  Other imaging options or evaluations could include CT colonoscopy ordered as a screening procedure.  I think that would be covered under Dawn Potter insurance.  She has tried Linzess previously without benefit.  I suppose we could try other pharmacologic therapy though I doubt it would provide benefit.  Could consider Motegrity though it is not on Dawn Potter formulary.  I doubt pharmacologic therapy will resolve this I think she should see a surgeon and we can come up with a plan.  She does use Benadryl and phentermine some which certainly can aggravate constipation problems especially the phentermine in my experience.   That is an intermittent medication and herproblems do predate its use.  I appreciate the opportunity to care for this patient. CC: Olivia Mackie, MD  Subjective:   Chief Complaint: Follow-up after failed manometry for chronic constipation and question of volvulus  HPI 56 year old white woman here for follow-up, last seen by me in May 2022, at which point I recommended an anorectal manometry.  The probe could not be inserted adequately and the procedure was aborted.  Then there was a long period of time where she had to protest the charges that were applied to Dawn Potter account and she did not want to come back and see me until that was cleared.   She has chronic constipation that she relates developing many years ago after an exploratory laparotomy, with imaging showing a dilated sigmoid colon and suspicion for colonic volvulus raised during admission 09/27/2020.  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  It was not confirmed.  She was having excruciating pain that was building over time and lack of bowel movements.  She kept working through things.  She was in extreme distress when she went to the hospital.     Imaging results: (Direct vision of images as well)   IMPRESSION: 1. No evidence for small bowel obstruction.  The colon is diffusely dilated with cecum measuring up to 7-8 cm diameter. Sigmoid colon measures up to 5 cm diameter. The sigmoid colon is markedly elongated and redundant, extending up into the right abdomen, anterior to the liver. There is an area of narrowing in the distal sigmoid colon as it enters the low pelvis with some associated mesenteric twisting, but a second area of sigmoid colon narrowing cannot be identified. There is no colonic wall thickening or associated  pericolonic edema/intraperitoneal fluid. Imaging features may be related to marked colonic ileus/colonic dysmotility with the single area of narrowing bean related to sigmoid colon becoming non dependent towards the rectum in the low pelvis. Sigmoid volvulus is also a consideration, but not a definite CT finding. 2. Complex cystic lesions in the left adnexal space/ovary measuring up to 3.6 cm. These may be hemorrhagic cysts. Pelvic ultrasound follow-up recommended to further evaluate.     Electronically Signed   By: Kennith Center M.D.   On: 09/26/2020 14:32   Ovarian lesions evaluated by Dr. Billy Coast and negative IMPRESSION: Markedly redundant and dilated sigmoid colon. Negative for sigmoid volvulus.   Large amount of retained stool left colon.   No obstructing mass lesion   Transverse and right colon not evaluated due to markedly redundant colon and large volume colon. Question chronic colonic dysmotility.     Electronically Signed   By: Marlan Palau M.D.   On: 09/27/2020 09:30      Colonoscopy was accomplished to the hepatic flexure by Dr. Nathaniel Man and was negative to that point.  Report findings as below     Dilated in the entire examined colon. - Non-bleeding external and internal hemorrhoids. - No specimens collected.   -She had some chest symptoms afterwards and had an infiltrate and was treated for potential aspiration pneumonia.     We reviewed Dawn Potter overall history and in the early 20s she had an exploratory laparotomy for any fever she says and she was found to have a kidney infection.  Ever since then she has had chronic constipation.  She is tried Linzess in the past when she had seen Dr. Loreta Ave and was also placed on probiotics at one point.  Nothing is helped too much overall.  She is currently using a mixture of Benefiber and stool softener   She goes for days without a good bowel movement.  She gets an urge to defecate but then has difficulty producing a stool.   She is worried something nefarious could be going on as Dawn Potter colonoscopy was not complete.  Also because of the severity of Dawn Potter symptoms.     She does have urge urinary incontinence.  Current symptoms  She continues with chronic constipation and takes several doses of daily as well is Benefiber each 3 times daily.  In October she had a "bad attack again" with terrible pain and lack of defecation.  It was sort of a sudden onset in the middle of hay baling season so she took Tylenol frequently to relieve the pain and she was able to bail hay.  She increased MiraLAX at that time as well and eventually things opened up and that resolved.  She reports stools are soft and skinny on the MiraLAX.  She is concerned about impacts of these problems on Dawn Potter job and life overall.  She reviewed the problems with the anal manometry as outlined above and we discussed that.  Allergies  Allergen Reactions   Ketoconazole Hives   Current Meds  Medication Sig   diphenhydrAMINE (BENADRYL) 25 MG tablet Take 25 mg by mouth every 6 (six) hours as needed for sleep.   estradiol (ESTRACE) 1 MG tablet Take 1 mg by mouth daily.   famotidine (PEPCID) 40 MG/5ML suspension Take by mouth daily.   fluticasone (FLONASE SENSIMIST) 27.5 MCG/SPRAY nasal spray Place 1-2 sprays into the nose daily.   levonorgestrel (MIRENA, 52 MG,) 20 MCG/DAY IUD 1 each by Intrauterine route once.   loratadine (CLARITIN REDITABS) 10 MG dissolvable tablet Take 10 mg by mouth daily.   Multiple Vitamins-Minerals (MULTIVITAMIN WITH IRON-MINERALS) liquid Take by mouth daily.   NON FORMULARY fetirizine   Vibegron (GEMTESA PO) Take by mouth.   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   Review of Systems As per HPI  Objective:   Physical Exam BP 110/82   Pulse 74   Ht 5\' 8"  (1.727 m)   Wt 192 lb (87.1 kg)   BMI 29.19 kg/m   35 minutes total time on visit

## 2022-07-31 ENCOUNTER — Other Ambulatory Visit: Payer: Self-pay | Admitting: General Surgery

## 2022-07-31 DIAGNOSIS — K5900 Constipation, unspecified: Secondary | ICD-10-CM

## 2022-09-04 ENCOUNTER — Ambulatory Visit
Admission: RE | Admit: 2022-09-04 | Discharge: 2022-09-04 | Disposition: A | Discharge status: Home / Self Care | Payer: BC Managed Care – PPO | Source: Ambulatory Visit | Attending: General Surgery | Admitting: General Surgery

## 2022-09-04 DIAGNOSIS — K5900 Constipation, unspecified: Secondary | ICD-10-CM

## 2022-09-04 MED ORDER — IOPAMIDOL (ISOVUE-300) INJECTION 61%
100.0000 mL | Freq: Once | INTRAVENOUS | Status: AC | PRN
  Administered 2022-09-04: 100 mL via INTRAVENOUS

## 2022-11-02 IMAGING — CT CT ABD-PELV W/ CM
2 of 5 series · 15 of 46 positions shown, 17 images · IV contrast (omnipaque)
Comparison: 12/08/2003

CLINICAL DATA: Abdominal pain.  Concern for bowel obstruction.

EXAM:
CT ABDOMEN AND PELVIS WITH CONTRAST
TECHNIQUE: Multidetector CT imaging of the abdomen and pelvis was performed
using the standard protocol following bolus administration of
intravenous contrast.
CONTRAST:  80mL OMNIPAQUE IOHEXOL 300 MG/ML  SOLN

[Series 2: abd pel w · axial · 0.72mm/px · z∈[+981,+1396]mm · 12 of 93 slices shown, 14 images]
[im 5/93  soft-tissue]
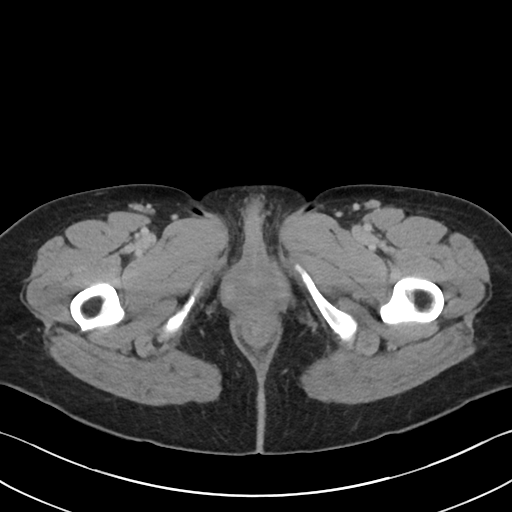
[im 5/93  bone]
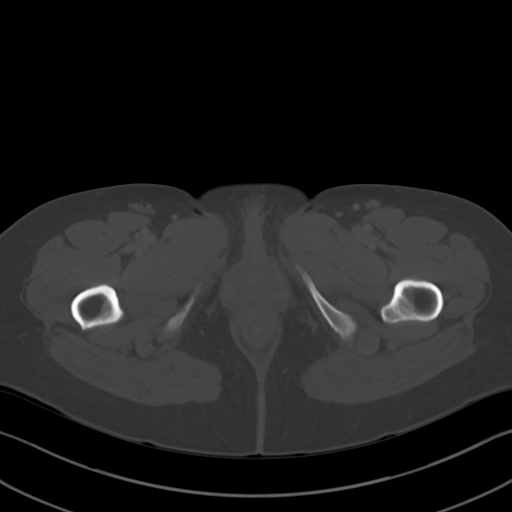
[im 15/93  soft-tissue]
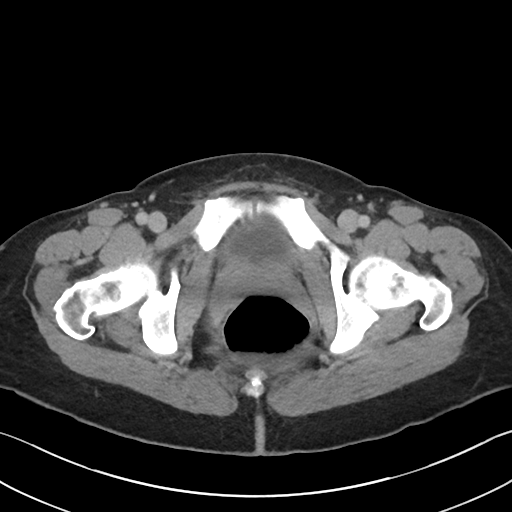
[im 20/93  soft-tissue]
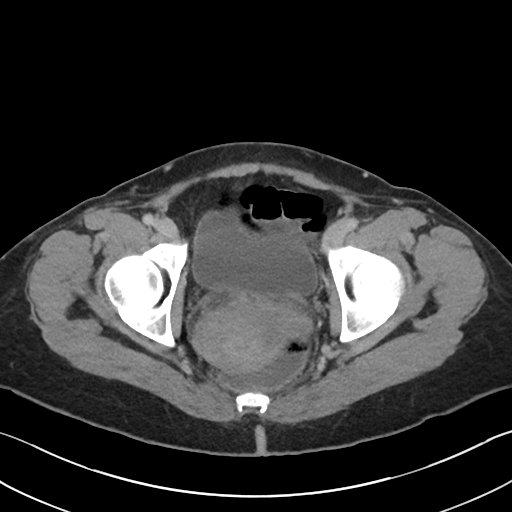
[im 30/93  soft-tissue]
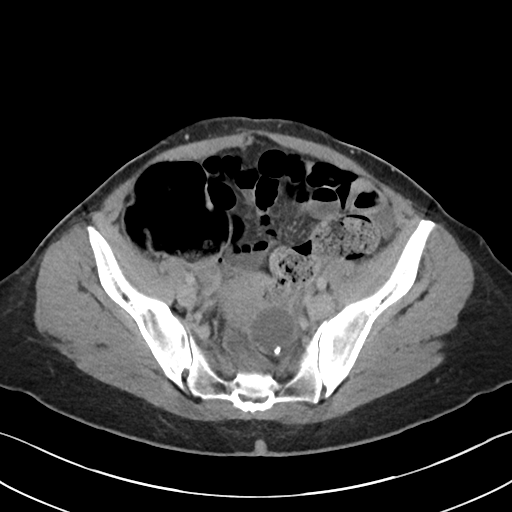
[im 34/93  soft-tissue]
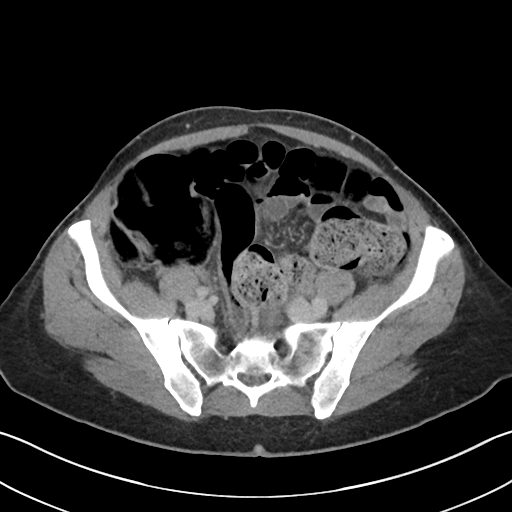
[im 44/93  soft-tissue]
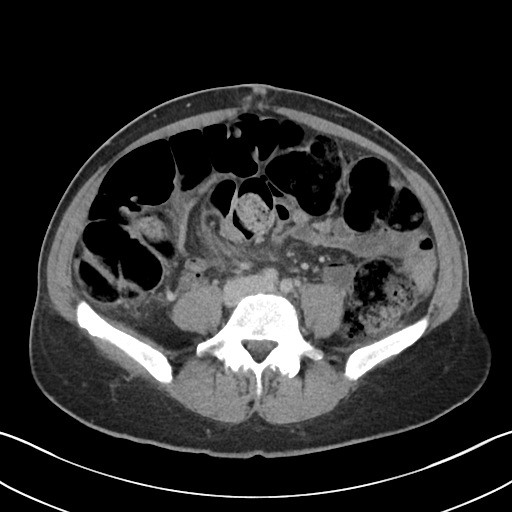
[im 49/93  soft-tissue]
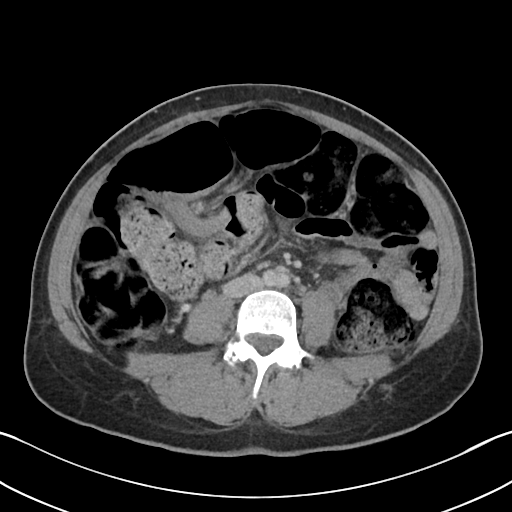
[im 59/93  soft-tissue]
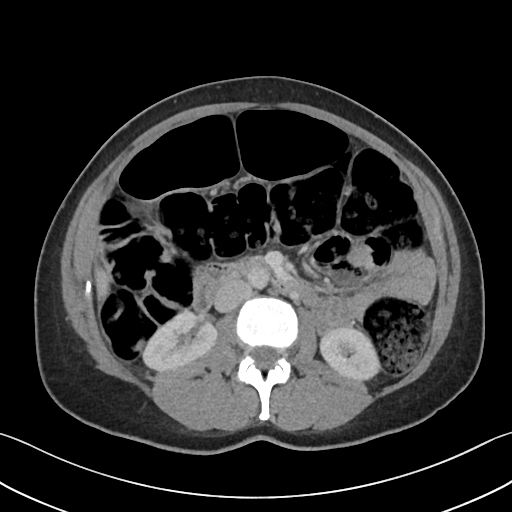
[im 63/93  soft-tissue]
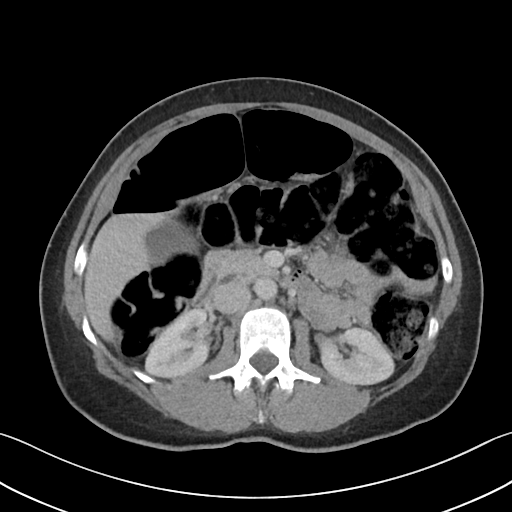
[im 63/93  bone]
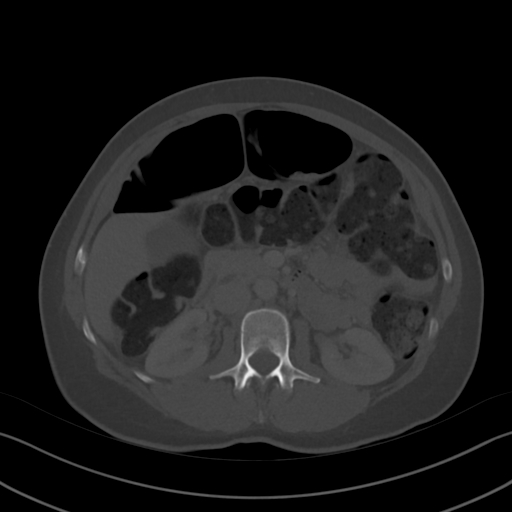
[im 73/93  soft-tissue]
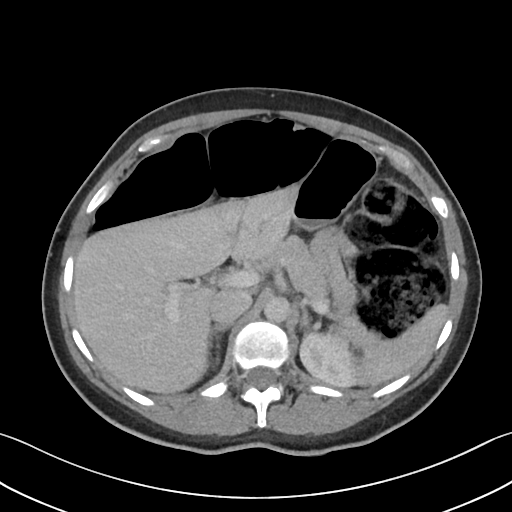
[im 78/93  soft-tissue]
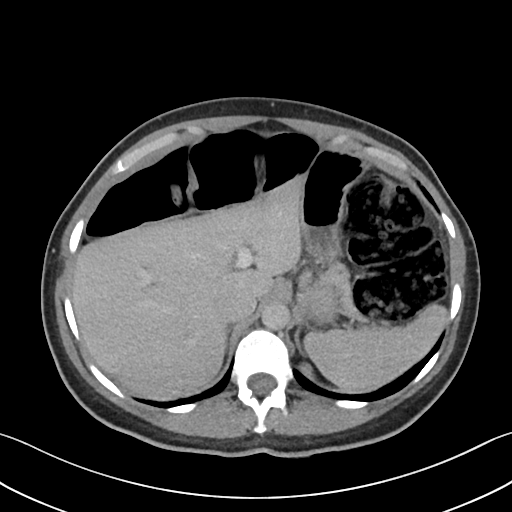
[im 88/93  soft-tissue]
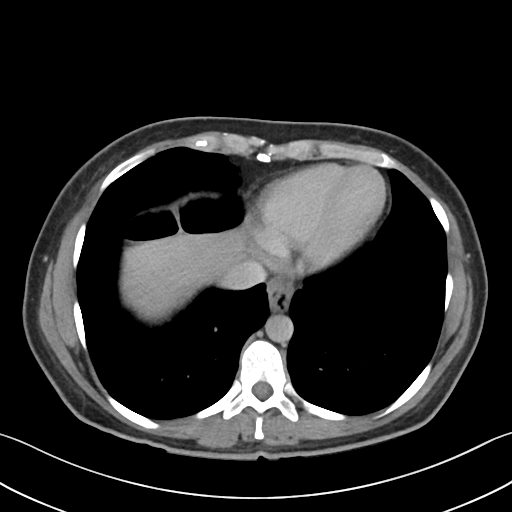

[Series 5: coronal · coronal · 0.91mm/px · 3 of 99 slices shown]
[im 33/99  soft-tissue]
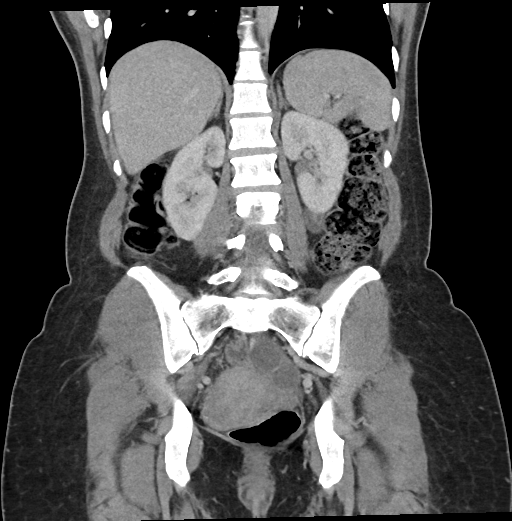
[im 44/99  soft-tissue]
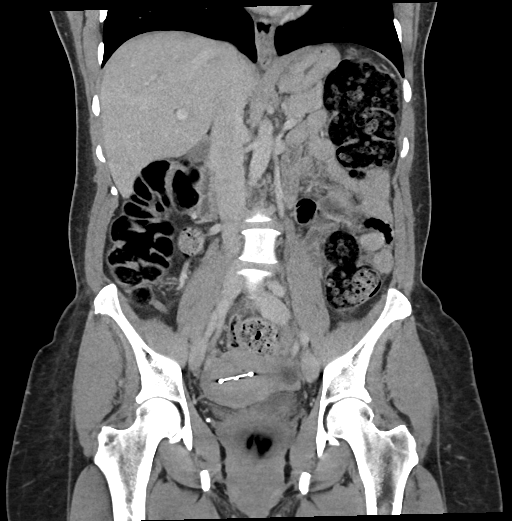
[im 55/99  soft-tissue]
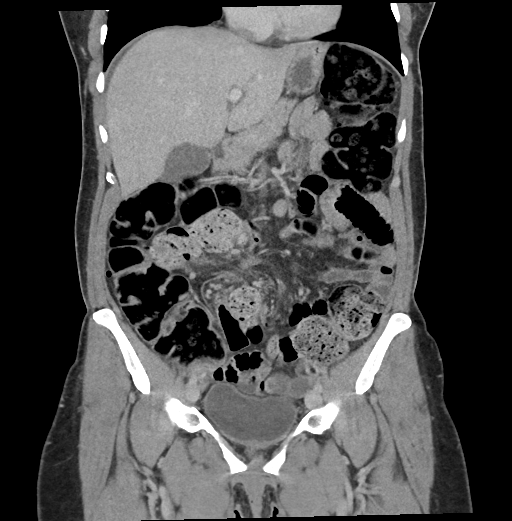

[15 of 46 positions shown; findings below may reference images not displayed]

FINDINGS: Lower chest: 3 mm right lower lobe nodule on image 1 of series 4 is
stable in the long interval since prior study consistent with benign
etiology.

Hepatobiliary: No suspicious focal abnormality within the liver
parenchyma. There is no evidence for gallstones, gallbladder wall
thickening, or pericholecystic fluid. No intrahepatic or
extrahepatic biliary dilation.

Pancreas: No focal mass lesion. No dilatation of the main duct. No
intraparenchymal cyst. No peripancreatic edema.

Spleen: No splenomegaly. No focal mass lesion.

Adrenals/Urinary Tract: No adrenal nodule or mass. Kidneys
unremarkable. No evidence for hydroureter. The urinary bladder
appears normal for the degree of distention.

Stomach/Bowel: Tiny hiatal hernia. Stomach is decompressed. Duodenum
is normally positioned as is the ligament of Treitz. Small bowel is
nondilated. Terminal ileum is well seen on coronal image 52/5 and is
positioned just anterior to the right psoas muscle on axial 55/2
with the ileocecal valve visible on image 56/2. The ascending and
transverse colon is dilated and stool-filled in the colon remains
distended and stool-filled down to the level of the sigmoid segment.
This sigmoid segment is markedly elongated and extends up into the
anterior right abdomen, anterior to the liver. The sigmoid segment
is air-filled and dilated with no substantial stool volume. There is
mesenteric twisting with 1 area of narrowing identified in the
sigmoid colon in the right central abdomen (axial image 44/2)
representing the distal sigmoid colon although a more proximal area
of sigmoid colon narrowing is not evident. Distal sigmoid colon and
rectum are fluid-filled.

Vascular/Lymphatic: No abdominal aortic aneurysm. There is no
gastrohepatic or hepatoduodenal ligament lymphadenopathy. No
retroperitoneal or mesenteric lymphadenopathy. No pelvic sidewall
lymphadenopathy.

Reproductive: Adjacent cystic structures identified in the left
adnexal space measuring 3.6 cm in 3.0 cm. Both lesions have
attenuation higher than would be expected for simple fluid. No right
adnexal mass. IUD is visualized in the uterus.

Other: No intraperitoneal free fluid.

Musculoskeletal: No worrisome lytic or sclerotic osseous
abnormality.
IMPRESSION: 1. No evidence for small bowel obstruction. The colon is diffusely
dilated with cecum measuring up to 7-8 cm diameter. Sigmoid colon
measures up to 5 cm diameter. The sigmoid colon is markedly
elongated and redundant, extending up into the right abdomen,
anterior to the liver. There is an area of narrowing in the distal
sigmoid colon as it enters the low pelvis with some associated
mesenteric twisting, but a second area of sigmoid colon narrowing
cannot be identified. There is no colonic wall thickening or
associated pericolonic edema/intraperitoneal fluid. Imaging features
may be related to marked colonic ileus/colonic dysmotility with the
single area of narrowing Ti Patron related to sigmoid colon becoming non
dependent towards the rectum in the low pelvis. Sigmoid volvulus is
also a consideration, but not a definite CT finding.
2. Complex cystic lesions in the left adnexal space/ovary measuring
up to 3.6 cm. These may be hemorrhagic cysts. Pelvic ultrasound
follow-up recommended to further evaluate.

## 2022-11-03 IMAGING — RF DG BE SINGLE CONTRAST
7 of 15 series · 7 of 15 positions shown · IV contrast (omnipaque)
Comparison: CT abdomen pelvis 09/26/2020

CLINICAL DATA: Constipation.  Abnormal CT.  Dilated sigmoid colon

EXAM:
WATER SOLUBLE CONTRAST ENEMA
TECHNIQUE: Water-soluble enema performed with 600 mL Omnipaque 300 mixed with
9999 mL water.
FLUOROSCOPY TIME:  Fluoroscopy Time:  5 minutes 42 second
Radiation Exposure Index (if provided by the fluoroscopic device):
Number of Acquired Spot Images: 14

[Series 1: t abdomen supine · 0.15mm/px · 1 of 1 slices shown]
[im 1/1]
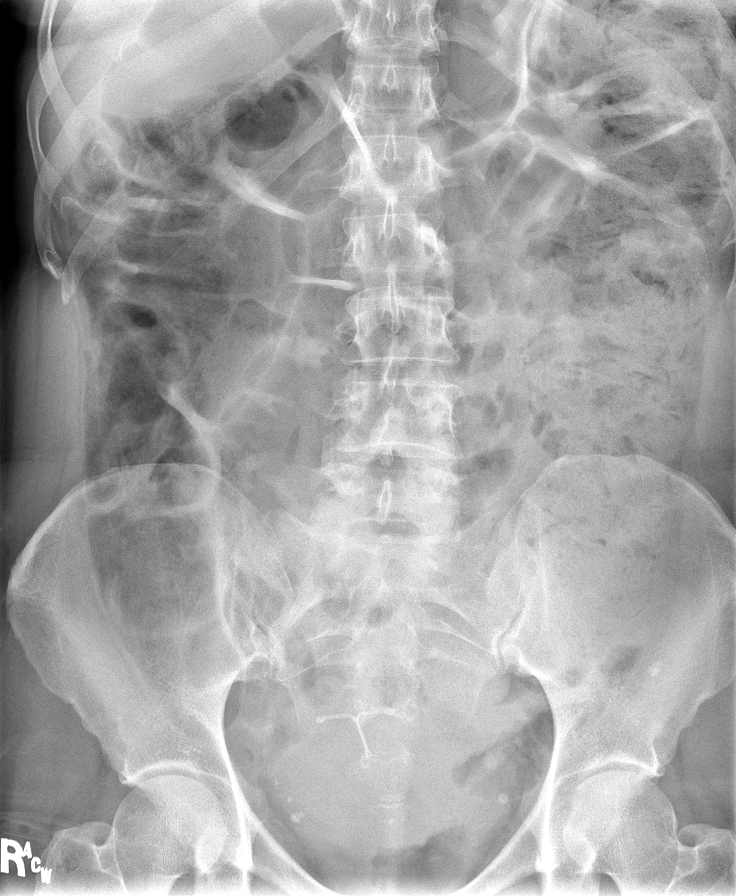

[Series 2: fluoro_barium singleshot_bw · 0.19mm/px · 1 of 1 slices shown (1 of 6)]
[im 1/1]
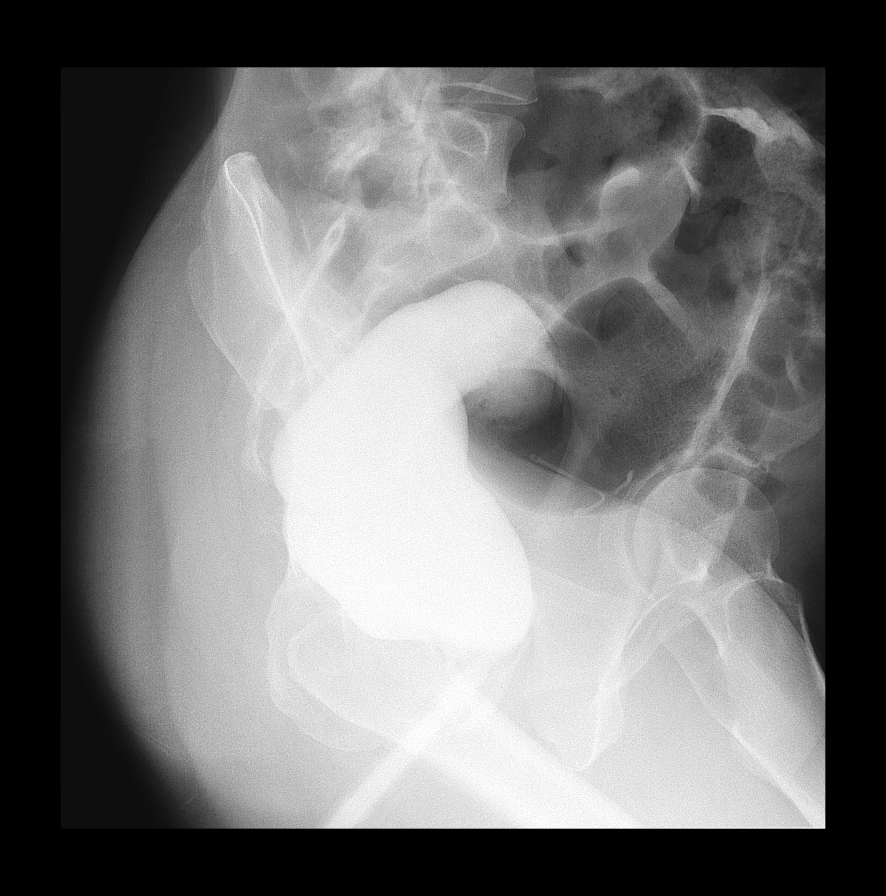

[Series 3: fluoro_barium singleshot_bw · 0.19mm/px · 1 of 1 slices shown (2 of 6)]
[im 1/1]
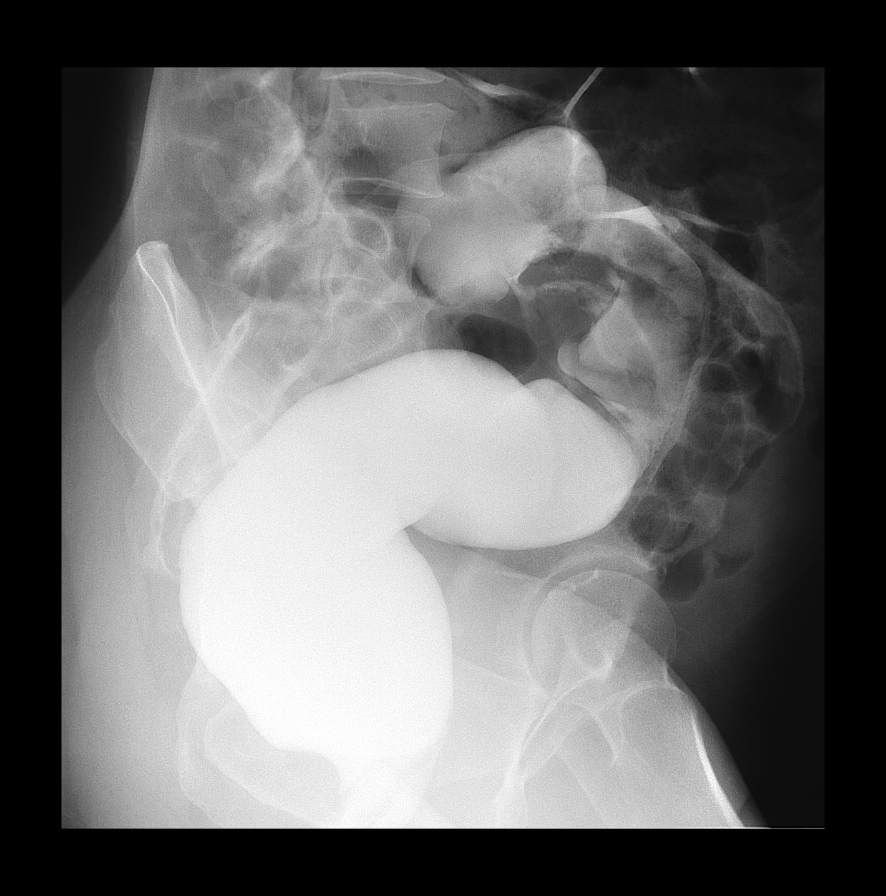

[Series 4: fluoro_barium singleshot_bw · 0.19mm/px · 1 of 1 slices shown (3 of 6)]
[im 1/1]
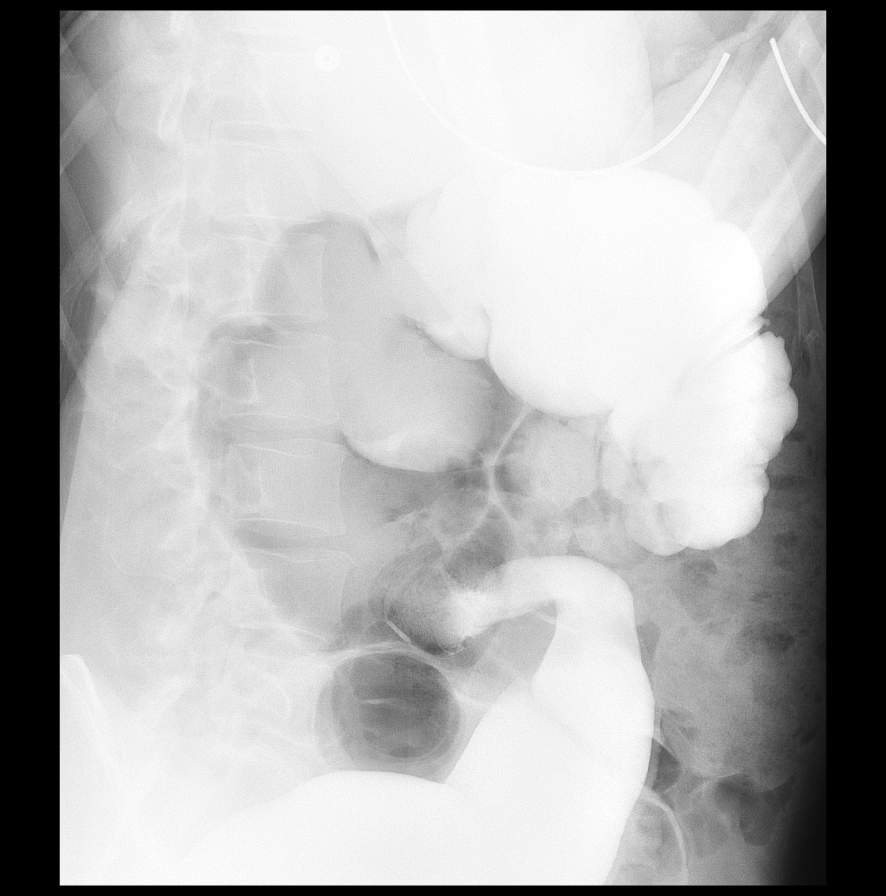

[Series 5: fluoro_barium singleshot_bw · 0.19mm/px · 1 of 1 slices shown (4 of 6)]
[im 1/1]
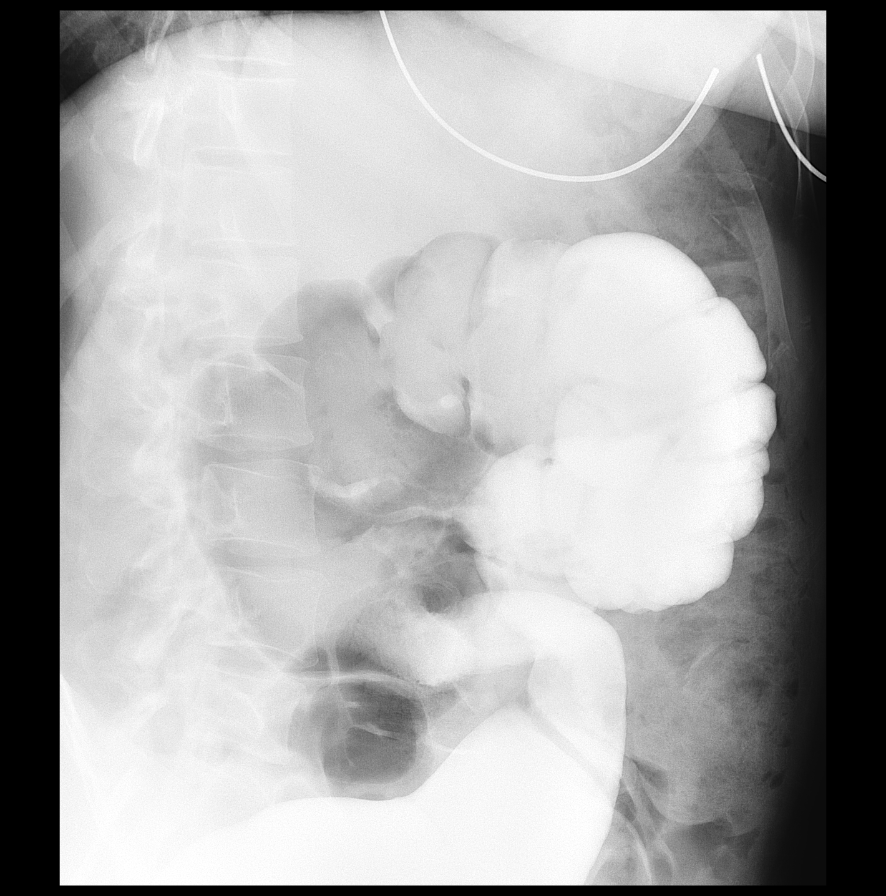

[Series 6: fluoro_barium singleshot_bw · 0.20mm/px · 1 of 1 slices shown (5 of 6)]
[im 1/1]
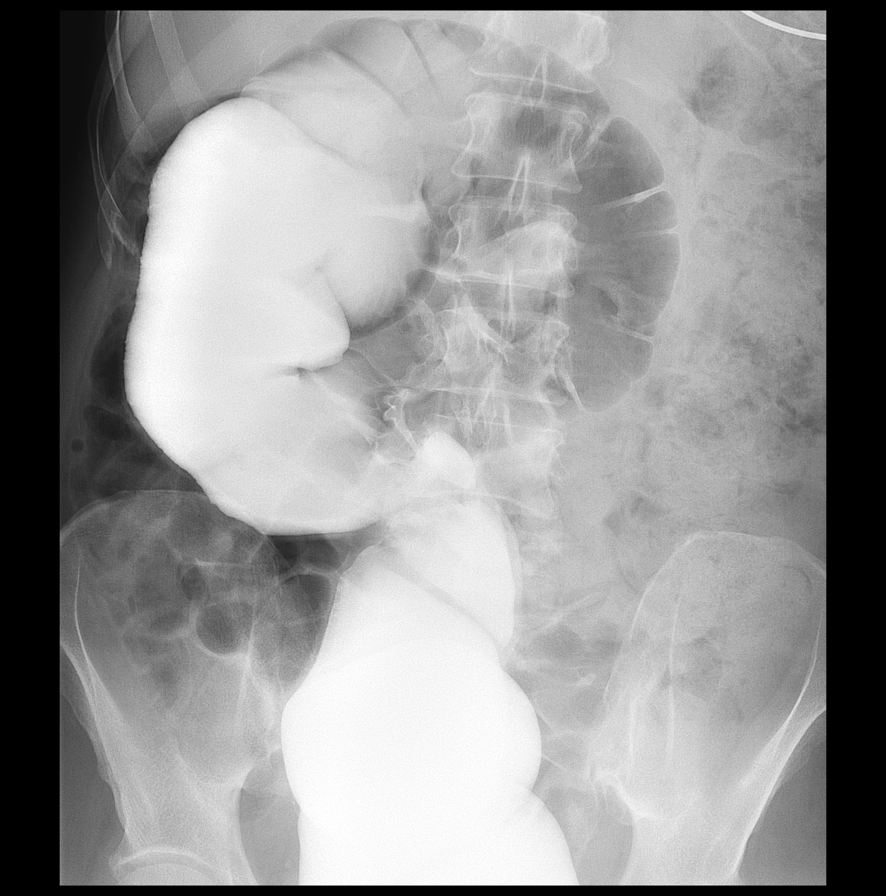

[Series 7: fluoro_barium singleshot_bw · 0.19mm/px · 1 of 1 slices shown (6 of 6)]
[im 1/1]
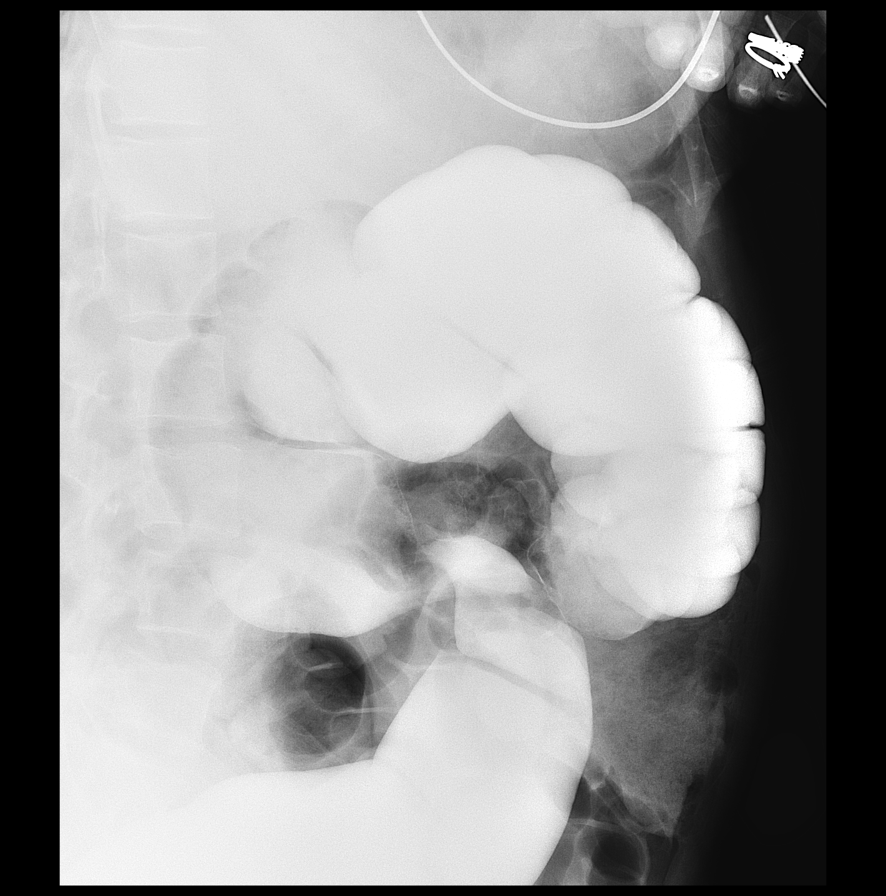

[7 of 15 positions shown; findings below may reference images not displayed]

FINDINGS: Preliminary KUB demonstrates moderate amount of stool particularly
in the sigmoid colon. Mild gaseous distention of the colon diffusely
including the right colon. IUD noted.

Normal rectum. Sigmoid colon is markedly redundant and dilated.
Evaluation of the sigmoid colon was difficult with multiple
positions including decubitus and prone positioning. There is a
large amount of retained stool in the left colon. Eventually
contrast progressed into the splenic flexure however the transverse
colon and right colon were not opacified during the study despite
multiple positional maneuvers.

No mass lesion identified. No mucosal edema or diverticular change.
No evidence of sigmoid volvulus.
IMPRESSION: Markedly redundant and dilated sigmoid colon. Negative for sigmoid
volvulus.

Large amount of retained stool left colon.

No obstructing mass lesion

Transverse and right colon not evaluated due to markedly redundant
colon and large volume colon. Question chronic colonic dysmotility.

## 2022-11-05 IMAGING — DX DG CHEST 1V
1 series · 1 of 1 positions shown · non-contrast
Comparison: None.

CLINICAL DATA: Chest pain after colonoscopy.

EXAM:
CHEST  1 VIEW

[chest ap]
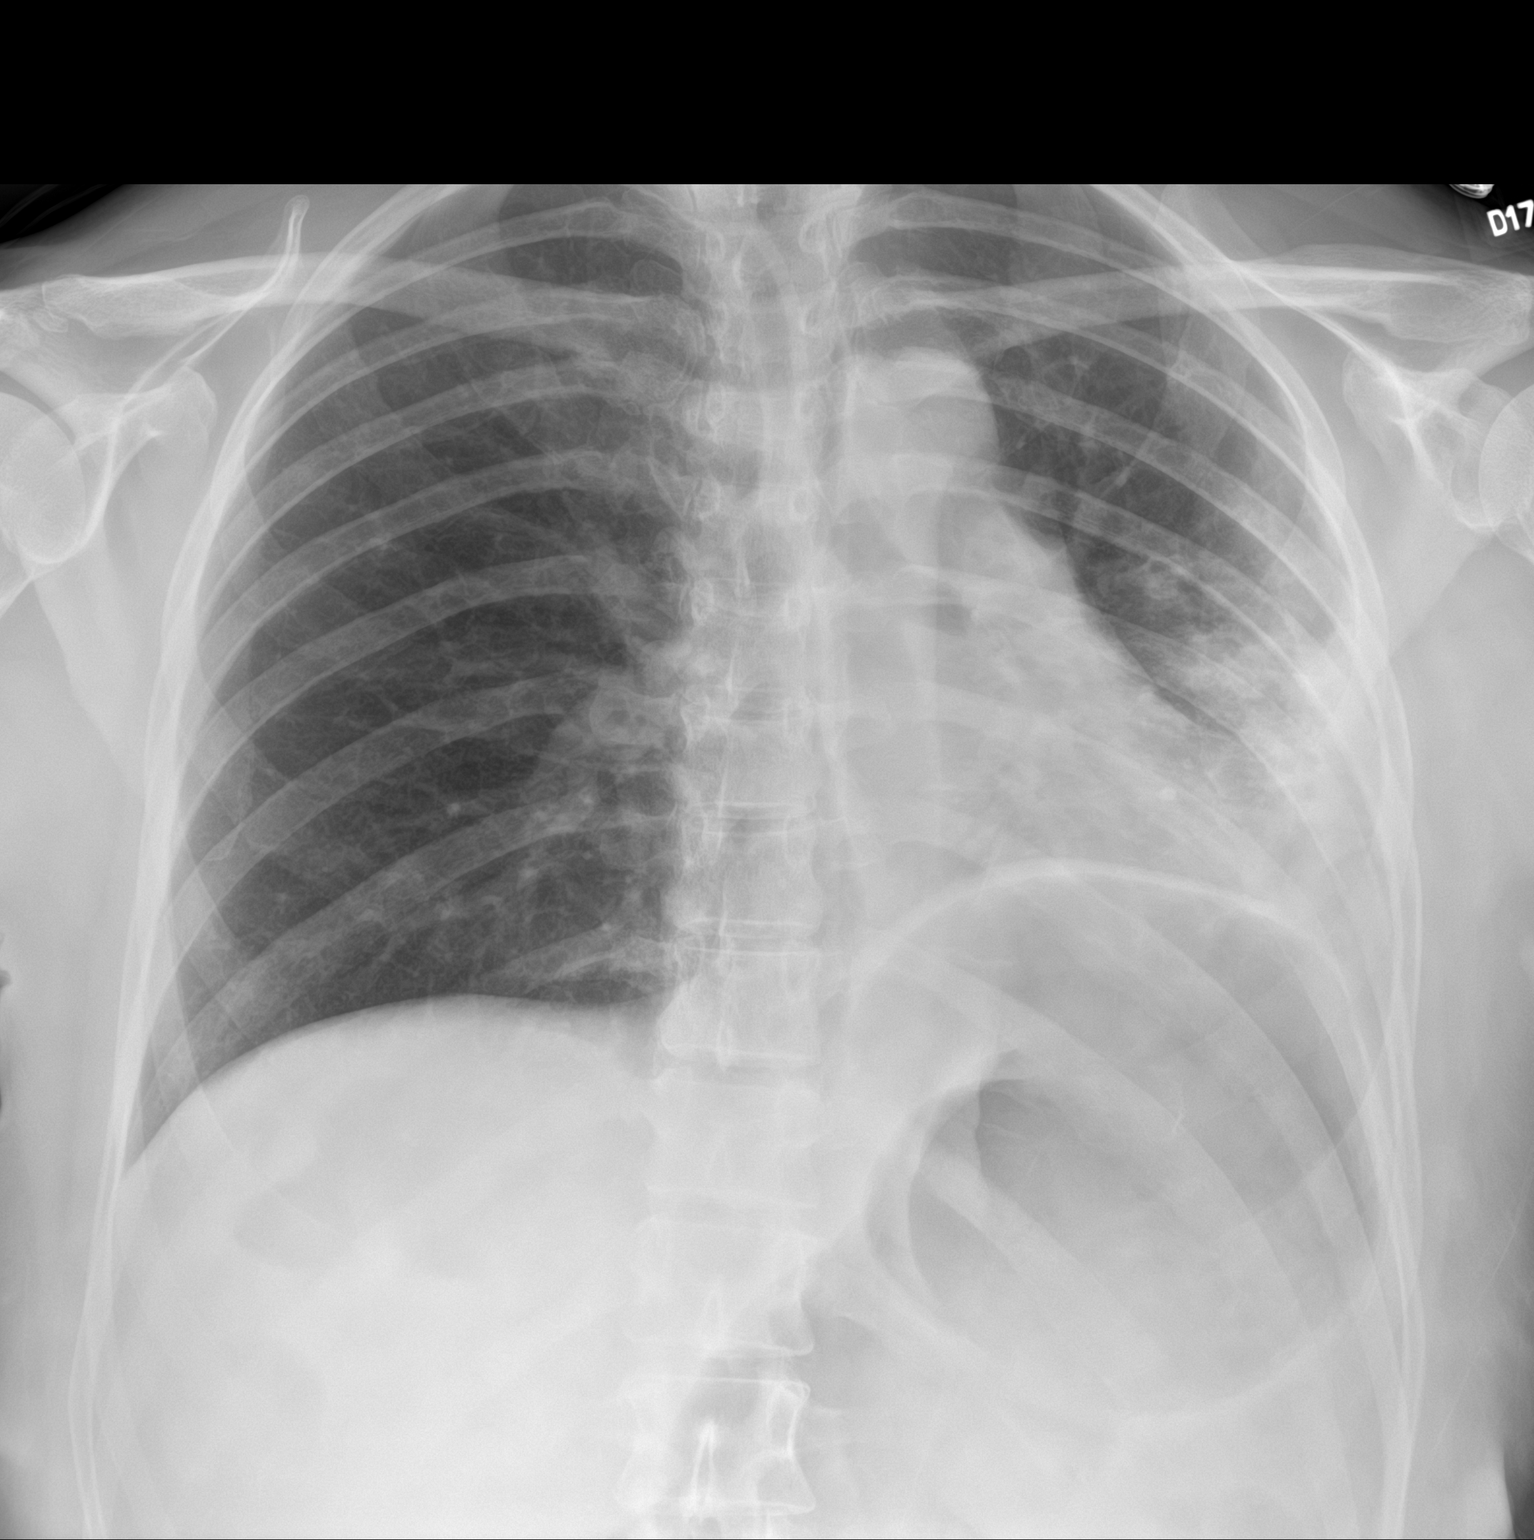

[1 of 1 positions shown; findings below may reference images not displayed]

FINDINGS: Mild gaseous distention of the stomach is noted. No obvious free
intraperitoneal air. The cardiac silhouette, mediastinal and hilar
contours are within normal limits. Mild eventration of the left
hemidiaphragm.

Left lower lobe airspace process consistent with pneumonia.
Aspiration would be a consideration. The right lung is clear.
IMPRESSION: Left lower lobe airspace process consistent with pneumonia.
Aspiration would be a consideration.

No findings suspicious for free intraperitoneal air.
